# Patient Record
Sex: Male | Born: 1965 | Race: White | Hispanic: No | Marital: Single | Smoking: Current every day smoker
Health system: Southern US, Community
[De-identification: ages and names within clinical notes are randomized; demographics above are authoritative.]

## PROBLEM LIST (undated history)

## (undated) DIAGNOSIS — H9319 Tinnitus, unspecified ear: Secondary | ICD-10-CM

## (undated) DIAGNOSIS — Z720 Tobacco use: Secondary | ICD-10-CM

## (undated) DIAGNOSIS — F1010 Alcohol abuse, uncomplicated: Secondary | ICD-10-CM

## (undated) HISTORY — DX: Tobacco use: Z72.0

## (undated) HISTORY — DX: Tinnitus, unspecified ear: H93.19

## (undated) HISTORY — PX: FRACTURE SURGERY: SHX138

## (undated) HISTORY — DX: Alcohol abuse, uncomplicated: F10.10

---

## 2012-06-10 ENCOUNTER — Encounter (HOSPITAL_COMMUNITY): Payer: Self-pay | Admitting: *Deleted

## 2012-06-10 ENCOUNTER — Emergency Department (HOSPITAL_COMMUNITY)
Admission: EM | Admit: 2012-06-10 | Discharge: 2012-06-10 | Disposition: A | Discharge status: Home / Self Care | Payer: Self-pay | Attending: Emergency Medicine | Admitting: Emergency Medicine

## 2012-06-10 DIAGNOSIS — R197 Diarrhea, unspecified: Secondary | ICD-10-CM | POA: Insufficient documentation

## 2012-06-10 DIAGNOSIS — K529 Noninfective gastroenteritis and colitis, unspecified: Secondary | ICD-10-CM

## 2012-06-10 DIAGNOSIS — F17200 Nicotine dependence, unspecified, uncomplicated: Secondary | ICD-10-CM | POA: Insufficient documentation

## 2012-06-10 LAB — DIFFERENTIAL
Basophils Absolute: 0 10*3/uL (ref 0.0–0.1)
Lymphocytes Relative: 29 % (ref 12–46)
Monocytes Absolute: 0.7 10*3/uL (ref 0.1–1.0)
Neutro Abs: 3.9 10*3/uL (ref 1.7–7.7)
Neutrophils Relative %: 58 % (ref 43–77)

## 2012-06-10 LAB — COMPREHENSIVE METABOLIC PANEL
ALT: 18 U/L (ref 0–53)
AST: 18 U/L (ref 0–37)
Alkaline Phosphatase: 58 U/L (ref 39–117)
CO2: 24 mEq/L (ref 19–32)
Chloride: 102 mEq/L (ref 96–112)
GFR calc non Af Amer: >90 mL/min (ref >90)
Potassium: 3.8 mEq/L (ref 3.5–5.1)
Sodium: 139 mEq/L (ref 135–145)
Total Bilirubin: 0.7 mg/dL (ref 0.3–1.2)

## 2012-06-10 LAB — CBC
HCT: 46 % (ref 39.0–52.0)
Platelets: 179 10*3/uL (ref 150–400)
RDW: 12.3 % (ref 11.5–15.5)
WBC: 6.7 10*3/uL (ref 4.0–10.5)

## 2012-06-10 MED ORDER — SULFAMETHOXAZOLE-TRIMETHOPRIM 800-160 MG PO TABS: 1.0000 | ORAL_TABLET | Freq: Two times a day (BID) | ORAL | Status: AC

## 2012-06-10 MED ORDER — LOPERAMIDE HCL 2 MG PO CAPS: 2.0000 mg | ORAL_CAPSULE | Freq: Four times a day (QID) | ORAL | Status: AC | PRN

## 2012-06-10 NOTE — ED Notes (Signed)
Pt reports diarrhea for 6 mths, dizziness and pressure in head. Pt states that when he leans over for any length of time a clear real sticky fluid will gush from nose.  Feels as if there is water in his head and has constant slushing feeling.  Pt alert oriented X4

## 2012-06-10 NOTE — ED Notes (Signed)
Pt alert no distress.  Sitting  With no complaints

## 2012-06-10 NOTE — ED Provider Notes (Signed)
History   This chart was scribed for Nelia Shi, MD by Sofie Rower. The patient was seen in room TR07C/TR07C and the patient's care was started at 2:38 PM     CSN: [id number]  Arrival date & time 06/10/12  1244   None     Chief Complaint  Patient presents with  . Diarrhea  . Otalgia    (Consider location/radiation/quality/duration/timing/severity/associated sxs/prior treatment) Patient is a 46 y.o. male presenting with diarrhea and ear pain. The history is provided by the patient. No language interpreter was used.  Diarrhea The primary symptoms include diarrhea. Primary symptoms do not include fever, nausea or vomiting. The illness began more than 7 days ago. The onset was gradual. The problem has not changed since onset. The diarrhea began more than 1 week ago. The diarrhea is watery. The diarrhea occurs 2 to 4 times per day.  The illness does not include chills, back pain or itching.  Otalgia Associated symptoms include diarrhea. Pertinent negatives include no vomiting.    Xavier [name] is a 46 y.o. male who presents to the Emergency Department complaining of moderate, episodic diarrhea (X 3-4 a day) onset six months ago with associated symptoms of ear pain, dizziness. The pt informs the EDP that he has not ate much today, he does not feel as if he can give a stool at present. The pt is an avid camper. Modifying factors include taking Pepto bismol (one month ago) which provides moderate relief, taking 1 antibiotic pill which provides no relief.  Pt denies mucus in diarrhea, severe pain, significant weight loss.    Past Surgical History  Procedure Date  . Fracture surgery      History  Substance Use Topics  . Smoking status: Current Everyday Smoker  . Smokeless tobacco: Not on file  . Alcohol Use: Yes      Review of Systems  Constitutional: Negative for fever and chills.  HENT: Positive for ear pain.   Gastrointestinal: Positive for diarrhea. Negative for  nausea and vomiting.  Musculoskeletal: Negative for back pain.  Skin: Negative for itching.  All other systems reviewed and are negative.    Allergies  Review of patient's allergies indicates no known allergies.  Home Medications   Current Outpatient Rx  Name Route Sig Dispense Refill  . LOPERAMIDE HCL 2 MG PO CAPS Oral Take 1 capsule (2 mg total) by mouth 4 (four) times daily as needed for diarrhea or loose stools. 12 capsule 0  . SULFAMETHOXAZOLE-TRIMETHOPRIM 800-160 MG PO TABS Oral Take 1 tablet by mouth 2 (two) times daily. 10 tablet 0    BP 152/89  Temp 98.1 F (36.7 C) (Oral)  Resp 18  Wt 200 lb (90.719 kg)  SpO2 96%  Physical Exam  Nursing note and vitals reviewed. Constitutional: He is oriented to person, place, and time. He appears well-developed and well-nourished. No distress.  HENT:  Head: Normocephalic and atraumatic.  Eyes: Pupils are equal, round, and reactive to light.  Neck: Normal range of motion.  Cardiovascular: Normal rate and intact distal pulses.   Pulmonary/Chest: No respiratory distress.  Abdominal: Normal appearance. He exhibits no distension.  Musculoskeletal: Normal range of motion.  Neurological: He is alert and oriented to person, place, and time. No cranial nerve deficit.  Skin: Skin is warm and dry. No rash noted.  Psychiatric: He has a normal mood and affect. His behavior is normal.    ED Course  Procedures (including critical care time)  DIAGNOSTIC STUDIES: Oxygen Saturation  is 96% on room air, adequate by my interpretation.    COORDINATION OF CARE:   2:43PM- EDP at bedside discusses treatment plan concerning blood work and stool sample.   Results for orders placed during the hospital encounter of 06/10/12  CBC      Component Value Range   WBC 6.7  4.0 - 10.5 K/uL   RBC 4.73  4.22 - 5.81 MIL/uL   Hemoglobin 16.7  13.0 - 17.0 g/dL   HCT 40.9  81.1 - 91.4 %   MCV 97.3  78.0 - 100.0 fL   MCH 35.3 (*) 26.0 - 34.0 pg   MCHC  36.3 (*) 30.0 - 36.0 g/dL   RDW 78.2  95.6 - 21.3 %   Platelets 179  150 - 400 K/uL  DIFFERENTIAL      Component Value Range   Neutrophils Relative 58  43 - 77 %   Neutro Abs 3.9  1.7 - 7.7 K/uL   Lymphocytes Relative 29  12 - 46 %   Lymphs Abs 1.9  0.7 - 4.0 K/uL   Monocytes Relative 11  3 - 12 %   Monocytes Absolute 0.7  0.1 - 1.0 K/uL   Eosinophils Relative 2  0 - 5 %   Eosinophils Absolute 0.1  0.0 - 0.7 K/uL   Basophils Relative 0  0 - 1 %   Basophils Absolute 0.0  0.0 - 0.1 K/uL  COMPREHENSIVE METABOLIC PANEL      Component Value Range   Sodium 139  135 - 145 mEq/L   Potassium 3.8  3.5 - 5.1 mEq/L   Chloride 102  96 - 112 mEq/L   CO2 24  19 - 32 mEq/L   Glucose, Bld 88  70 - 99 mg/dL   BUN 12  6 - 23 mg/dL   Creatinine, Ser 0.86  0.50 - 1.35 mg/dL   Calcium 9.5  8.4 - 57.8 mg/dL   Total Protein 7.7  6.0 - 8.3 g/dL   Albumin 4.5  3.5 - 5.2 g/dL   AST 18  0 - 37 U/L   ALT 18  0 - 53 U/L   Alkaline Phosphatase 58  39 - 117 U/L   Total Bilirubin 0.7  0.3 - 1.2 mg/dL   GFR calc non Af Amer >90  >90 mL/min   GFR calc Af Amer >90  >90 mL/min    No results found.   1. Chronic diarrhea       MDM        I personally performed the services described in this documentation, which was scribed in my presence. The recorded information has been reviewed and considered.     Nelia Shi, MD 06/10/12 [phone]

## 2012-06-10 NOTE — ED Notes (Signed)
Patient states diarrhea x 6 months, denies n/v,  Patient also with builateral ear pain x 2 years, patient states he also gets dizzy and feels as if equilibrium is off

## 2012-06-10 NOTE — Discharge Instructions (Signed)
Chronic Diarrhea Diarrhea is loose, watery stools. Having diarrhea means passing loose stools 3 or more times a day. Diarrhea that lasts longer than 4 weeks is considered long-lasting (chronic). Symptoms of chronic diarrhea may be continual or may come and go. People of all ages can get diarrhea. Body fluid loss (dehydration) may occur as a result of diarrhea. This means the body does not have as many fluids and salts (electrolytes) as it needs. CAUSES  There are many causes of chronic diarrhea. Causes may be different for children and adults. The various causes can be grouped into 2 categories: diarrhea caused by an infection and diarrhea not caused by an infection. Sometimes, the cause is unknown. Diarrhea caused by an infection may result from:  Parasites.   Bacteria.   Viral infections.  Diarrhea not caused by an infection may result from:  Irritable bowel syndrome.   Reaction to medicines, such as antibiotics, cancer drugs, blood pressure medicines, and antacids.   Intestinal disease (Crohn's disease, ulcerative colitis, celiac disease).   Food allergies or sensitivity to additives (fructose, lactose, sugar substitutes).   Tumors.   Diabetes, thyroid disease, and other endocrine diseases.   Reduced blood flow to the intestine.   Previous surgery or radiation of the abdomen or gastrointestinal tract.  Risk factors for chronic diarrhea include:  Having a severely weakened immune system, such as from HIV/AIDS.   Taking certain types of cancer-fighting drugs (chemotherapy) or other medicines.   A recent organ transplant.   Having a portion of the stomach removed.   Traveling to countries where food and water supplies are often contaminated.  SYMPTOMS  In addition to frequent, loose stools, diarrhea may cause:  Cramping.   Abdominal pain.   Nausea.   Urgent need to use the bathroom, or loss of bowel control.  If dehydration occurs, problems include:  Thirst.    Less frequent urination.   Dark urine.   Dry skin.   Fatigue.   Dizziness.  Infections that cause diarrhea may also cause a fever, chills, or bloody stools. DIAGNOSIS  Diagnosis may be difficult. Your caregiver must take a careful history and perform a physical exam. Tests given are based on your symptoms and history. Tests may include:  Blood or stool tests, in which 3 or more stool samples may be examined. Stool cultures may be used to test for bacteria or parasites.   X-rays.   A procedure in which a thin tube is inserted into the mouth or rectum (endoscopy). This allows the caregiver to look inside the intestine.  TREATMENT   Diarrhea caused by an infection can often be treated with antibiotics.   Diarrhea not caused by an infection is more difficult to diagnose and treat. Long-term medicine use or surgery may be required. Specific treatment should be discussed with your caregiver.   If the cause cannot determined, treatment to relieve symptoms includes:   Preventing dehydration. Serious health problems can occur if you do not maintain proper fluid levels. Many oral rehydration solutions (ORS) are available at drug stores. Ask your caregiver what product is best for you.   Not drinking beverages that contain caffeine (tea, coffee, soft drinks).   Not drinking alcohol. It causes dehydration.   Not relying on sports drinks and broths alone to maintain proper fluid levels. They should not be used to prevent severe dehydration.   Maintaining well-balanced nutrition. This may help you recover faster.  PREVENTION   Drink clean or purified water.   Use proper   food handling techniques.   Maintain proper hand-washing habits.  HOME CARE INSTRUCTIONS   Avoid:   Caffeine.   Greasy foods.   High fiber.   If you have problems digesting lactose during or after an episode of diarrhea, you might want to try yogurt. Yogurt is often better tolerated, because it has less  lactose than milk. Yogurt with active, live bacterial cultures may even help you recover faster.  SEEK MEDICAL CARE IF:  The person with diarrhea is an otherwise healthy adult and has:  Signs of dehydration.   Diarrhea for more than 2 days.   Severe pain in the abdomen or rectum.   An oral temperature above 102 F (38.9 C).   Stools containing blood or pus.   Stools that are black and tarry.  SEEK IMMEDIATE MEDICAL CARE IF:  The person with diarrhea is a child, elderly person, or has a weakened immune system and has:  Signs of dehydration.   Diarrhea for more than 1 day.   Severe pain in the abdomen or rectum.   An oral temperature above 102 F (38.9 C), not controlled by medicine.   Stools containing blood or pus.   Stools that are black and tarry.  Document Released: 02/23/2004 Document Revised: 11/22/2011 Document Reviewed: 04/21/2010 [employer] Patient Information 2012 [employer]. 

## 2013-12-30 ENCOUNTER — Encounter: Payer: Self-pay | Admitting: Medical

## 2013-12-30 ENCOUNTER — Ambulatory Visit (INDEPENDENT_AMBULATORY_CARE_PROVIDER_SITE_OTHER): Payer: BC Managed Care – PPO | Admitting: Medical

## 2013-12-30 VITALS — BP 130/70 | HR 88 | Temp 98.0°F | Resp 18 | Wt 195.0 lb

## 2013-12-30 DIAGNOSIS — F1010 Alcohol abuse, uncomplicated: Secondary | ICD-10-CM

## 2013-12-30 DIAGNOSIS — J310 Chronic rhinitis: Secondary | ICD-10-CM

## 2013-12-30 DIAGNOSIS — R220 Localized swelling, mass and lump, head: Secondary | ICD-10-CM

## 2013-12-30 DIAGNOSIS — H9319 Tinnitus, unspecified ear: Secondary | ICD-10-CM

## 2013-12-30 DIAGNOSIS — F17200 Nicotine dependence, unspecified, uncomplicated: Secondary | ICD-10-CM

## 2013-12-30 DIAGNOSIS — R221 Localized swelling, mass and lump, neck: Secondary | ICD-10-CM

## 2013-12-30 NOTE — Progress Notes (Signed)
  Subjective:  Xavier [name] is a 48 y.o. male who presents as a new patient.  No recent primary care.     For 1.5 years now he notes swelling in face, in front of/underneath ears.  Feels like this is causing headache, pain above eyes, inner ear pain, swollen feeling.  Used to sleep on his side/ side of face, but now due to pain has to turn sides sleeping every 30 minutes. +sensitivity in teeth but no pain while chewing.   Last dental visit 1999.  Gets runny nose, sneezing, feels lymph nodes swollen from time to time.   If bending down, gets clear to yellow nasal drainage.  Gets ringing in ears, never feels like ears clear up in terms of pressure.  No cough, no post nasal drainage.  Denies fever, NVD. +smoker, no chewing tobacco.  No other aggravating or relieving factors.    No other c/o.  The following portions of the patient's history were reviewed and updated as appropriate: allergies, current medications, past family history, past medical history, past social history, past surgical history and problem list.  ROS Otherwise as in subjective above  Objective: Physical Exam  BP 130/70  Pulse 88  Temp(Src) 98 F (36.7 C) (Oral)  Resp 18  Wt 195 lb (88.451 kg)   General appearance: alert, no distress, WD/WN HEENT: normocephalic, sclerae anicteric, fullness of the preauricular areas, but no obvious parotitis or mass or obvious swelling, conjunctiva pink and moist, TMs pearly, nares with turbinate, clear discharge, pharynx normal Oral cavity: MMM, no lesions Neck: supple, shoddy nontender anterior and posterior lymph nodes, no thyromegaly, no masses Heart: RRR, normal S1, S2, no murmurs Lungs: CTA bilaterally, no wheezes, rhonchi, or rales Abdomen: +bs, soft, non tender, non distended, no masses, no hepatomegaly, no splenomegaly Pulses: 2+ radial pulses, 2+ pedal pulses, normal cap refill Ext: no edema   Assessment: Encounter  Diagnoses  Name Primary?  . Facial swelling Yes  . Rhinitis   . Tinnitus   . Tobacco use disorder   . Excessive drinking alcohol     Plan: Discussed risks of tobacco and excessive alcohol use.  Advised he decrease his alcohol consumption and sugary drinks .  Advised smoking cessation.  Discussed possible causes of his symptom and facial swelling.    Patient Instructions  Recommendations:  Begin Zyrtec, Benadryl or Allegra OTC at bedtime, daily for the next 3-4 weeks  Begin Ibuprofen OTC 200mg , 3 tablets twice daily or Aleve OTC twice daily for 1-2 week  Drink 5-6 glasses of water daily  Try and limit sugary drinks and alcohol  Suck on sour lemon drops for the next week  If not much improved in 3-4 weeks, we can consider evaluation to include labs, head and neck scan, chest xray

## 2013-12-30 NOTE — Patient Instructions (Signed)
Recommendations:  Begin Zyrtec, Benadryl or Allegra OTC at bedtime, daily for the next 3-4 weeks  Begin Ibuprofen OTC 200mg , 3 tablets twice daily or Aleve OTC twice daily for 1-2 week  Drink 5-6 glasses of water daily  Try and limit sugary drinks and alcohol  Suck on sour lemon drops for the next week  If not much improved in 3-4 weeks, we can consider evaluation to include labs, head and neck scan, chest xray

## 2014-01-27 ENCOUNTER — Ambulatory Visit: Payer: BC Managed Care – PPO | Admitting: Medical

## 2014-02-03 ENCOUNTER — Ambulatory Visit (INDEPENDENT_AMBULATORY_CARE_PROVIDER_SITE_OTHER): Payer: BC Managed Care – PPO | Admitting: Medical

## 2014-02-03 ENCOUNTER — Encounter: Payer: Self-pay | Admitting: Medical

## 2014-02-03 VITALS — BP 130/80 | HR 78 | Temp 97.6°F | Resp 14 | Wt 196.0 lb

## 2014-02-03 DIAGNOSIS — M6530 Trigger finger, unspecified finger: Secondary | ICD-10-CM

## 2014-02-03 DIAGNOSIS — R599 Enlarged lymph nodes, unspecified: Secondary | ICD-10-CM

## 2014-02-03 DIAGNOSIS — R220 Localized swelling, mass and lump, head: Secondary | ICD-10-CM

## 2014-02-03 DIAGNOSIS — K0510 Chronic gingivitis, plaque induced: Secondary | ICD-10-CM

## 2014-02-03 DIAGNOSIS — F17200 Nicotine dependence, unspecified, uncomplicated: Secondary | ICD-10-CM

## 2014-02-03 DIAGNOSIS — R221 Localized swelling, mass and lump, neck: Secondary | ICD-10-CM

## 2014-02-03 LAB — BASIC METABOLIC PANEL
BUN: 12 mg/dL (ref 6–23)
CALCIUM: 9.7 mg/dL (ref 8.4–10.5)
CO2: 29 meq/L (ref 19–32)
Chloride: 101 mEq/L (ref 96–112)
Creat: 0.9 mg/dL (ref 0.50–1.35)
Glucose, Bld: 89 mg/dL (ref 70–99)
POTASSIUM: 4.2 meq/L (ref 3.5–5.3)
SODIUM: 140 meq/L (ref 135–145)

## 2014-02-03 LAB — CBC WITH DIFFERENTIAL/PLATELET
BASOS ABS: 0 10*3/uL (ref 0.0–0.1)
Basophils Relative: 0 % (ref 0–1)
Eosinophils Absolute: 0.2 10*3/uL (ref 0.0–0.7)
Eosinophils Relative: 3 % (ref 0–5)
HEMATOCRIT: 45 % (ref 39.0–52.0)
HEMOGLOBIN: 16.2 g/dL (ref 13.0–17.0)
LYMPHS ABS: 1.6 10*3/uL (ref 0.7–4.0)
LYMPHS PCT: 32 % (ref 12–46)
MCH: 35.1 pg — ABNORMAL HIGH (ref 26.0–34.0)
MCHC: 36 g/dL (ref 30.0–36.0)
MCV: 97.4 fL (ref 78.0–100.0)
MONO ABS: 0.6 10*3/uL (ref 0.1–1.0)
MONOS PCT: 12 % (ref 3–12)
NEUTROS ABS: 2.7 10*3/uL (ref 1.7–7.7)
Neutrophils Relative %: 53 % (ref 43–77)
Platelets: 213 10*3/uL (ref 150–400)
RBC: 4.62 MIL/uL (ref 4.22–5.81)
RDW: 12.9 % (ref 11.5–15.5)
WBC: 5.1 10*3/uL (ref 4.0–10.5)

## 2014-02-03 LAB — SEDIMENTATION RATE: SED RATE: 1 mm/h (ref 0–16)

## 2014-02-03 NOTE — Progress Notes (Signed)
  Subjective:  Xavier [name] is a 48 y.o. male who presents for recheck.  After last visit used lemon drops, antihistamine, ibuprofen, and no change in symptoms.       For 1.5 years now he notes swelling in face, in front of/underneath ears.  Feels like this is causing headache, pain above eyes, inner ear pain, swollen feeling.  Used to sleep on his side/ side of face, has to turn sides sleeping every 30 minutes on either side of face due to the swelling pain in bilat face.  +sensitivity in teeth but no pain while chewing.   Last dental visit 1999.  Gets runny nose, sneezing, feels lymph nodes swollen from time to time.   If bending down, gets clear to yellow nasal drainage.  Gets ringing in ears, never feels like ears clear up in terms of pressure.  No cough, no post nasal drainage.  Denies fever, NVD. +smoker, some limited chewing tobacco in the past.  No other aggravating or relieving factors.    He has bilat middle fingers that stick in place.  The right one is worse.  Been this way for months, worsening. Does cause some pain.  No other c/o.   The following portions of the patient's history were reviewed and updated as appropriate: allergies, current medications, past family history, past medical history, past social history, past surgical history and problem list.  ROS Otherwise as in subjective above  Objective: Physical Exam  BP 130/80  Pulse 78  Temp(Src) 97.6 F (36.4 C) (Oral)  Resp 14  Wt 196 lb (88.905 kg)   General appearance: alert, no distress, WD/WN HEENT: normocephalic, sclerae anicteric, fullness of the preauricular areas, but no obvious parotitis or mass or obvious swelling, conjunctiva pink and moist, TMs pearly, nares with turbinate, clear discharge, pharynx normal Oral cavity: MMM, no lesions Neck: supple, shoddy nontender anterior and posterior lymph nodes, no thyromegaly, no masses Heart: RRR, normal S1,  S2, no murmurs Lungs: CTA bilaterally, no wheezes, rhonchi, or rales Abdomen: +bs, soft, non tender, non distended, no masses, no hepatomegaly, no splenomegaly Pulses: 2+ radial pulses, 2+ pedal pulses, normal cap refill Ext: no edema MSK: bilat locking trigger fingers middle/ 3rd fingers.  Palpable small bony nodules in both volar hand 3rd tendon sheaths.  Otherwise hands nontender, normal ROM. Neuro: cn2-12 intact, nonfocal exam   Assessment: Encounter Diagnoses  Name Primary?  . Neck swelling Yes  . Facial swelling   . Gingivitis   . Enlargement of lymph nodes   . Tobacco use disorder   . Trigger finger, acquired     Plan: Will have some baseline labs, chest xray, neck and maxillofacial CT per radiology opinion to further eval neck and facial swelling bilat, enlarged lymph nodes, hx/o tobacco use, both smoking and chewing.    Referral to hand surgery for bilat middle finger trigger fingers.  Advised he see dentist for significant plaque and gingival disease.  Discussed risks of tobacco and excessive alcohol use.  Advised he decrease his alcohol consumption and sugary drinks .  Advised smoking cessation.    Return pending studies, labs, referral.

## 2014-02-04 LAB — TSH: TSH: 2.177 u[IU]/mL (ref 0.350–4.500)

## 2014-02-08 ENCOUNTER — Telehealth: Payer: Self-pay | Admitting: Medical

## 2014-02-08 NOTE — Telephone Encounter (Signed)
Frustrating for him and us.  Just let us know when ready to move on with next steps.  He may want to clarify with current/prior insurance on coverage's and dates of service.  Timing and dates of service and coverage can be tricky.

## 2014-02-09 NOTE — Telephone Encounter (Signed)
Called and left message for pt. CT scan has been cancelled for this Friday. Pt will have to bring in new insurance for in April for us to get a prior auth on this and once we get it approved we will schedule him

## 2014-02-12 ENCOUNTER — Other Ambulatory Visit: Payer: Self-pay

## 2014-03-29 ENCOUNTER — Telehealth: Payer: Self-pay | Admitting: Medical

## 2014-03-29 NOTE — Telephone Encounter (Signed)
Patient is aware of his appointment for his imaging on 04/05/14 @ 215 pm. CLS

## 2014-03-29 NOTE — Telephone Encounter (Signed)
Go ahead and schedule the orders: see ordered imaging from few months ago - CT maxillofacial, CT neck, chest xray

## 2014-03-30 ENCOUNTER — Other Ambulatory Visit: Payer: Self-pay | Admitting: Family Medicine

## 2014-03-31 ENCOUNTER — Telehealth: Payer: Self-pay | Admitting: Medical

## 2014-03-31 NOTE — Telephone Encounter (Signed)
fyi

## 2014-04-05 ENCOUNTER — Other Ambulatory Visit: Payer: Self-pay | Admitting: Family Medicine

## 2014-04-05 ENCOUNTER — Other Ambulatory Visit: Payer: Self-pay

## 2014-04-05 ENCOUNTER — Ambulatory Visit
Admission: RE | Admit: 2014-04-05 | Discharge: 2014-04-05 | Disposition: A | Discharge status: Home / Self Care | Payer: PRIVATE HEALTH INSURANCE | Source: Ambulatory Visit | Attending: Medical | Admitting: Medical

## 2014-04-05 ENCOUNTER — Ambulatory Visit: Payer: PRIVATE HEALTH INSURANCE

## 2014-04-05 DIAGNOSIS — R220 Localized swelling, mass and lump, head: Secondary | ICD-10-CM

## 2014-04-05 DIAGNOSIS — M6530 Trigger finger, unspecified finger: Secondary | ICD-10-CM

## 2014-04-05 DIAGNOSIS — R599 Enlarged lymph nodes, unspecified: Secondary | ICD-10-CM

## 2014-04-05 DIAGNOSIS — F17200 Nicotine dependence, unspecified, uncomplicated: Secondary | ICD-10-CM

## 2014-04-05 DIAGNOSIS — R221 Localized swelling, mass and lump, neck: Secondary | ICD-10-CM

## 2014-04-05 DIAGNOSIS — K0510 Chronic gingivitis, plaque induced: Secondary | ICD-10-CM

## 2014-04-05 MED ORDER — IOHEXOL 300 MG/ML  SOLN
75.0000 mL | Freq: Once | INTRAMUSCULAR | Status: AC | PRN
  Administered 2014-04-05: 75 mL via INTRAVENOUS

## 2014-04-07 ENCOUNTER — Ambulatory Visit (INDEPENDENT_AMBULATORY_CARE_PROVIDER_SITE_OTHER): Payer: No Typology Code available for payment source | Admitting: Medical

## 2014-04-07 ENCOUNTER — Encounter: Payer: Self-pay | Admitting: Medical

## 2014-04-07 VITALS — BP 150/90 | HR 78 | Temp 98.2°F | Resp 18 | Wt 191.0 lb

## 2014-04-07 DIAGNOSIS — R030 Elevated blood-pressure reading, without diagnosis of hypertension: Secondary | ICD-10-CM

## 2014-04-07 DIAGNOSIS — R220 Localized swelling, mass and lump, head: Secondary | ICD-10-CM

## 2014-04-07 DIAGNOSIS — F411 Generalized anxiety disorder: Secondary | ICD-10-CM

## 2014-04-07 DIAGNOSIS — H9319 Tinnitus, unspecified ear: Secondary | ICD-10-CM

## 2014-04-07 DIAGNOSIS — R519 Headache, unspecified: Secondary | ICD-10-CM

## 2014-04-07 DIAGNOSIS — I889 Nonspecific lymphadenitis, unspecified: Secondary | ICD-10-CM

## 2014-04-07 DIAGNOSIS — IMO0001 Reserved for inherently not codable concepts without codable children: Secondary | ICD-10-CM

## 2014-04-07 DIAGNOSIS — R221 Localized swelling, mass and lump, neck: Secondary | ICD-10-CM

## 2014-04-07 DIAGNOSIS — R51 Headache: Secondary | ICD-10-CM

## 2014-04-07 DIAGNOSIS — F17200 Nicotine dependence, unspecified, uncomplicated: Secondary | ICD-10-CM

## 2014-04-07 LAB — LIPID PANEL
CHOL/HDL RATIO: 4.3 ratio
Cholesterol: 216 mg/dL — ABNORMAL HIGH (ref 0–200)
HDL: 50 mg/dL (ref >39)
LDL CALC: 97 mg/dL (ref 0–99)
Triglycerides: 344 mg/dL — ABNORMAL HIGH (ref <150)
VLDL: 69 mg/dL — ABNORMAL HIGH (ref 0–40)

## 2014-04-07 LAB — HEPATIC FUNCTION PANEL
ALK PHOS: 62 U/L (ref 39–117)
ALT: 32 U/L (ref 0–53)
AST: 26 U/L (ref 0–37)
Albumin: 4.5 g/dL (ref 3.5–5.2)
BILIRUBIN INDIRECT: 0.5 mg/dL (ref 0.2–1.2)
Bilirubin, Direct: 0.1 mg/dL (ref 0.0–0.3)
TOTAL PROTEIN: 7.3 g/dL (ref 6.0–8.3)
Total Bilirubin: 0.6 mg/dL (ref 0.2–1.2)

## 2014-04-07 MED ORDER — CYCLOBENZAPRINE HCL 10 MG PO TABS: ORAL_TABLET | ORAL | Status: DC

## 2014-04-07 NOTE — Progress Notes (Signed)
Subjective:  Xavier [name] is a 48 y.o. male who presents for recheck.    After his last visit we finally were able to get the imaging done for his head and neck and chest. He is here to discuss this a new symptoms of headaches.    For 1.5 years now he notes swelling in face, in front of/underneath ears.  Feels like this is causing headache, pain above eyes, inner ear pain, swollen feeling.  Used to sleep on his side/ side of face, has to turn sides sleeping every 30 minutes on either side of face due to the swelling pain in bilat face.  +sensitivity in teeth but no pain while chewing.   Last dental visit 1999.  Gets ringing in ears, never feels like ears clear up in terms of pressure.  No cough, no post nasal drainage.  Denies fever, NVD. +smoker, some limited chewing tobacco in the past.      Having headaches daily, regularly.  Headaches mostly all over, after a time goes to one side, usually right sided.  Headaches are 9-10/10 at first, then subside.  Does at times get blurred vision, occasional nausea.  No vision loss.  No numbness, tingling or weakness.   No hearing loss, but has ringing in ears.  At times walks into walls when having worse ringing.   These worse headaches started 2 wk ago.  Has bad allergies, been using benadryl.  Started nose spray.   Only used afrin once.  Currently allergies not so bad.  Stopped benadryl.   No family hx/o heart disease or stroke.  No other aggravating or relieving factors.     He notes anxiety problems for at least 3 years, even though he has worked with his coworkers for years, every morning when he goes into work has a panic attack.  He does feel stressed and feels like his headaches are attributed to stress.  He is currently taking care of his roommate who has terminal cancer, lately has had to do more for his room including helping feed his roommate.  Some of his anxiety he feels like stems from not  having a mother growing up.  Has 3 brothers. His never been on medicine for anxiety, never been to counseling except for brief period during his divorce  No other c/o.   The following portions of the patient's history were reviewed and updated as appropriate: allergies, current medications, past family history, past medical history, past social history, past surgical history and problem list.  ROS Otherwise as in subjective above  Objective: Physical Exam  BP 150/90  Pulse 78  Temp(Src) 98.2 F (36.8 C) (Oral)  Resp 18  Wt 191 lb (86.637 kg)  General appearance: alert, no distress, WD/WN , white male HEENT: normocephalic, sclerae anicteric, fullness of the preauricular areas, but no obvious parotitis or mass or obvious swelling, conjunctiva pink and moist, TMs pearly, nares with turbinate edema and clear discharge, pharynx normal  Oral cavity: MMM, no lesions  Neck: supple, nontender anterior and posterior lymph nodes, no obvious lymphadenopathy, no thyromegaly, no masses, no bruits Heart: RRR, normal S1, S2, no murmurs  Lungs: CTA bilaterally, no wheezes, rhonchi, or rales  Back: mild paraspinal upper back tenderness Abdomen: +bs, soft, non tender, non distended, no masses, no hepatomegaly, no splenomegaly  Pulses: 2+ radial pulses, 2+ pedal pulses, normal cap refill  Ext: no edema  Neuro: cn2-12 intact, nonfocal exam   Adult ECG Report  Indication: dizziness, smoker, excessive alcohol  consumption, elevateiond blood pressure, occipital headaches  Rate: 72bpm  Rhythm: normal sinus rhythm  QRS Axis: 61degrees  PR Interval: 180ms  QRS Duration: 98ms  QTc: 409ms  Conduction Disturbances: none  Other Abnormalities: none  Patient's cardiac risk factors are: male gender and smoking/ tobacco exposure.  EKG comparison: none  Narrative Interpretation: normal EKG     Assessment: Encounter Diagnoses  Name Primary?  . Facial swelling Yes  . Frequent headaches   .  Lymphadenitis   . Elevated blood pressure   . Tinnitus   . Headache, occipital     Plan: We reviewed his recent chest x-ray, maxillofacial and neck CTs.  given his new headaches, additional labs today, EKG reviewed above.  Vision exam normal Hearing exam normal.   Facial swelling that is not apparent on exam is likely due to gingival disease, dental disease, and minimal subparotid lymphadenitis. Advised he see dentist for significant plaque and gingival disease.  Headaches, sleep probvlems - We discussed his many concerns today.  Discussed risks of tobacco and excessive alcohol use.  Advised he decrease his alcohol consumption and sugary drinks .  Advised smoking cessation.  Begin Flexeril when necessary at bedtime for muscle tension, headaches, facial pain with sleep.  Anxiety, longstanding - discussed his concerns, treatment options, gave counseling, Referral to counseling, Marjie SkiffLauren Atkinson.  I asked him to check his blood pressures and write them down, return the readings in one week  Return 3-4 wk.

## 2014-04-08 LAB — SEDIMENTATION RATE: SED RATE: 1 mm/h (ref 0–16)

## 2014-04-08 NOTE — Progress Notes (Signed)
LM to CB

## 2014-04-13 NOTE — Progress Notes (Signed)
LMOM TO CB. X 2 CLS 

## 2018-01-20 ENCOUNTER — Inpatient Hospital Stay (HOSPITAL_COMMUNITY)
Admission: EM | Admit: 2018-01-20 | Discharge: 2018-01-27 | DRG: 339 | Disposition: A | Discharge status: Home / Self Care | Payer: Self-pay | Attending: General Surgery | Admitting: General Surgery

## 2018-01-20 ENCOUNTER — Emergency Department (HOSPITAL_COMMUNITY): Payer: Self-pay

## 2018-01-20 ENCOUNTER — Other Ambulatory Visit: Payer: Self-pay

## 2018-01-20 ENCOUNTER — Encounter (HOSPITAL_COMMUNITY): Payer: Self-pay | Admitting: *Deleted

## 2018-01-20 DIAGNOSIS — F17290 Nicotine dependence, other tobacco product, uncomplicated: Secondary | ICD-10-CM | POA: Diagnosis present

## 2018-01-20 DIAGNOSIS — F1010 Alcohol abuse, uncomplicated: Secondary | ICD-10-CM | POA: Diagnosis present

## 2018-01-20 DIAGNOSIS — K37 Unspecified appendicitis: Secondary | ICD-10-CM | POA: Diagnosis present

## 2018-01-20 DIAGNOSIS — K3533 Acute appendicitis with perforation and localized peritonitis, with abscess: Principal | ICD-10-CM | POA: Diagnosis present

## 2018-01-20 DIAGNOSIS — K567 Ileus, unspecified: Secondary | ICD-10-CM | POA: Diagnosis not present

## 2018-01-20 DIAGNOSIS — R42 Dizziness and giddiness: Secondary | ICD-10-CM | POA: Diagnosis not present

## 2018-01-20 LAB — COMPREHENSIVE METABOLIC PANEL
ALBUMIN: 4 g/dL (ref 3.5–5.0)
ALT: 15 U/L — AB (ref 17–63)
AST: 17 U/L (ref 15–41)
Alkaline Phosphatase: 70 U/L (ref 38–126)
Anion gap: 15 (ref 5–15)
BUN: 10 mg/dL (ref 6–20)
CHLORIDE: 99 mmol/L — AB (ref 101–111)
CO2: 23 mmol/L (ref 22–32)
CREATININE: 1.07 mg/dL (ref 0.61–1.24)
Calcium: 9.6 mg/dL (ref 8.9–10.3)
GFR calc Af Amer: >60 mL/min (ref >60)
GFR calc non Af Amer: >60 mL/min (ref >60)
GLUCOSE: 99 mg/dL (ref 65–99)
Potassium: 3.9 mmol/L (ref 3.5–5.1)
Sodium: 137 mmol/L (ref 135–145)
Total Bilirubin: 1.3 mg/dL — ABNORMAL HIGH (ref 0.3–1.2)
Total Protein: 8.1 g/dL (ref 6.5–8.1)

## 2018-01-20 LAB — CBC
HCT: 49.7 % (ref 39.0–52.0)
Hemoglobin: 17.3 g/dL — ABNORMAL HIGH (ref 13.0–17.0)
MCH: 35.6 pg — AB (ref 26.0–34.0)
MCHC: 34.8 g/dL (ref 30.0–36.0)
MCV: 102.3 fL — AB (ref 78.0–100.0)
PLATELETS: 232 10*3/uL (ref 150–400)
RBC: 4.86 MIL/uL (ref 4.22–5.81)
RDW: 12.6 % (ref 11.5–15.5)
WBC: 18 10*3/uL — ABNORMAL HIGH (ref 4.0–10.5)

## 2018-01-20 LAB — URINALYSIS, ROUTINE W REFLEX MICROSCOPIC
Bacteria, UA: NONE SEEN
Bilirubin Urine: NEGATIVE
Glucose, UA: NEGATIVE mg/dL
KETONES UR: 20 mg/dL — AB
Leukocytes, UA: NEGATIVE
Nitrite: NEGATIVE
PH: 5 (ref 5.0–8.0)
Protein, ur: 30 mg/dL — AB

## 2018-01-20 LAB — LIPASE, BLOOD: LIPASE: 25 U/L (ref 11–51)

## 2018-01-20 MED ORDER — MORPHINE SULFATE (PF) 4 MG/ML IV SOLN
4.0000 mg | Freq: Once | INTRAVENOUS | Status: AC
  Administered 2018-01-20: 4 mg via INTRAVENOUS
  Filled 2018-01-20: qty 1

## 2018-01-20 MED ORDER — METRONIDAZOLE IN NACL 5-0.79 MG/ML-% IV SOLN
500.0000 mg | Freq: Three times a day (TID) | INTRAVENOUS | Status: DC
  Administered 2018-01-21 – 2018-01-23 (×7): 500 mg via INTRAVENOUS
  Filled 2018-01-20 (×8): qty 100

## 2018-01-20 MED ORDER — IOPAMIDOL (ISOVUE-300) INJECTION 61%
INTRAVENOUS | Status: AC
  Administered 2018-01-20: 100 mL
  Filled 2018-01-20: qty 100

## 2018-01-20 MED ORDER — HYDROMORPHONE HCL 1 MG/ML IJ SOLN
1.0000 mg | INTRAMUSCULAR | Status: DC | PRN
  Administered 2018-01-21 – 2018-01-23 (×12): 1 mg via INTRAVENOUS
  Filled 2018-01-20 (×12): qty 1

## 2018-01-20 MED ORDER — SODIUM CHLORIDE 0.9 % IV SOLN
INTRAVENOUS | Status: DC
  Administered 2018-01-20 – 2018-01-26 (×12): via INTRAVENOUS

## 2018-01-20 MED ORDER — ONDANSETRON 4 MG PO TBDP
4.0000 mg | ORAL_TABLET | Freq: Once | ORAL | Status: AC
  Administered 2018-01-20: 4 mg via ORAL
  Filled 2018-01-20: qty 1

## 2018-01-20 MED ORDER — ACETAMINOPHEN 650 MG RE SUPP
650.0000 mg | Freq: Four times a day (QID) | RECTAL | Status: DC | PRN
Start: 2018-01-20 — End: 2018-01-27

## 2018-01-20 MED ORDER — ACETAMINOPHEN 325 MG PO TABS: 650.0000 mg | ORAL_TABLET | Freq: Four times a day (QID) | ORAL | Status: DC | PRN

## 2018-01-20 MED ORDER — DEXTROSE 5 % IV SOLN
2.0000 g | Freq: Once | INTRAVENOUS | Status: AC
  Administered 2018-01-20: 2 g via INTRAVENOUS
  Filled 2018-01-20: qty 2

## 2018-01-20 MED ORDER — KETOROLAC TROMETHAMINE 15 MG/ML IJ SOLN
15.0000 mg | Freq: Once | INTRAMUSCULAR | Status: AC
  Administered 2018-01-20: 15 mg via INTRAVENOUS
  Filled 2018-01-20: qty 1

## 2018-01-20 MED ORDER — ONDANSETRON 4 MG PO TBDP
4.0000 mg | ORAL_TABLET | Freq: Four times a day (QID) | ORAL | Status: DC | PRN
  Administered 2018-01-22: 4 mg via ORAL
  Filled 2018-01-20: qty 1

## 2018-01-20 MED ORDER — FENTANYL CITRATE (PF) 100 MCG/2ML IJ SOLN
50.0000 ug | Freq: Once | INTRAMUSCULAR | Status: AC
  Administered 2018-01-20: 50 ug via INTRAVENOUS
  Filled 2018-01-20: qty 2

## 2018-01-20 MED ORDER — DEXTROSE 5 % IV SOLN
2.0000 g | INTRAVENOUS | Status: DC
  Administered 2018-01-21 – 2018-01-26 (×6): 2 g via INTRAVENOUS
  Filled 2018-01-20 (×6): qty 2

## 2018-01-20 MED ORDER — ENOXAPARIN SODIUM 40 MG/0.4ML ~~LOC~~ SOLN
40.0000 mg | Freq: Every day | SUBCUTANEOUS | Status: DC
  Administered 2018-01-22 – 2018-01-27 (×6): 40 mg via SUBCUTANEOUS
  Filled 2018-01-20 (×7): qty 0.4

## 2018-01-20 MED ORDER — METRONIDAZOLE IN NACL 5-0.79 MG/ML-% IV SOLN
500.0000 mg | Freq: Once | INTRAVENOUS | Status: AC
  Administered 2018-01-20: 500 mg via INTRAVENOUS
  Filled 2018-01-20: qty 100

## 2018-01-20 MED ORDER — ONDANSETRON HCL 4 MG/2ML IJ SOLN
4.0000 mg | Freq: Four times a day (QID) | INTRAMUSCULAR | Status: DC | PRN
  Administered 2018-01-21 – 2018-01-25 (×4): 4 mg via INTRAVENOUS
  Filled 2018-01-20 (×3): qty 2

## 2018-01-20 MED ORDER — SODIUM CHLORIDE 0.9 % IV BOLUS (SEPSIS)
1000.0000 mL | Freq: Once | INTRAVENOUS | Status: AC
  Administered 2018-01-20: 1000 mL via INTRAVENOUS

## 2018-01-20 NOTE — ED Notes (Signed)
Surgeon at bedside.  

## 2018-01-20 NOTE — Progress Notes (Signed)
Patient arrived to 6N31 from ED with appendicitis. Alert and oriented x4. Vital signs stable. Denies pain at this time. Oriented to room and call bell.

## 2018-01-20 NOTE — ED Provider Notes (Signed)
Patient placed in Quick Look pathway, seen and evaluated   Chief Complaint: Right lower quadrant abdominal pain  HPI:   Patient presents to the ED for evaluation of right lower quadrant abdominal pain that started 1-2 days ago.  Pain does not radiate.  Progressively worsened.  Reports associated nausea and emesis.  Denies change in bowel habits, fevers.  ROS: Abdominal pain, nausea, emesis.  Denies any urinary symptoms, change in bowel habits, fever.  (one)  Physical Exam:   Gen: No distress  Neuro: Awake and Alert  Skin: Warm    Focused Exam: Right lower quadrant abdominal pain to palpation with guarding noted.  No rebound noted.  No CVA tenderness.  No signs of peritonitis.  Positive McBurney's point tenderness.  Negative Murphy sign.  Negative Rovsing sign.  Patient appears uncomfortable due to pain.  Initiation of care has begun. The patient has been counseled on the process, plan, and necessity for staying for the completion/evaluation, and the remainder of the medical screening examination.  Lab work and imaging ordered with along with pain medication given patient being uncomfortable in the room.    Rise MuLeaphart, Alta Shober T, PA-C 01/20/18 1933    Margarita Grizzleay, Danielle, MD 01/21/18 [phone]

## 2018-01-20 NOTE — H&P (Signed)
Xavier [name] is an 52 y.o. male.   Chief Complaint: abd pain HPI: 62 yom with abd pain primarily rlq since Sunday.  This has been progressive, nothing making it better, associated with diarrhea/n/v. No fevers. Not eating much.  States he may have had prior csc years ago while in prison.  Underwent ct scan that shows appendicitis, appears uncomplicated. He did not really endorse drinking but is all over chart.   Past Medical History:  Diagnosis Date  . Excessive drinking alcohol   . Tinnitus   . Tobacco use     Past Surgical History:  Procedure Laterality Date  . FRACTURE SURGERY      History reviewed. No pertinent family history. Social History:  reports that he has been smoking cigars.  He does not have any smokeless tobacco history on file. He reports that he drinks about 21.0 oz of alcohol per week. He reports that he does not use drugs.  Allergies: No Known Allergies  meds none  Results for orders placed or performed during the hospital encounter of 01/20/18 (from the past 48 hour(s))  Lipase, blood     Status: None   Collection Time: 01/20/18  7:25 PM  Result Value Ref Range   Lipase 25 11 - 51 U/L    Comment: Performed at [facility], [place] [address], [place], [place] [address]  Comprehensive metabolic panel     Status: Abnormal   Collection Time: 01/20/18  7:25 PM  Result Value Ref Range   Sodium 137 135 - 145 mmol/L   Potassium 3.9 3.5 - 5.1 mmol/L   Chloride 99 (L) 101 - 111 mmol/L   CO2 23 22 - 32 mmol/L   Glucose, Bld 99 65 - 99 mg/dL   BUN 10 6 - 20 mg/dL   Creatinine, Ser 1.07 0.61 - 1.24 mg/dL   Calcium 9.6 8.9 - 10.3 mg/dL   Total Protein 8.1 6.5 - 8.1 g/dL   Albumin 4.0 3.5 - 5.0 g/dL   AST 17 15 - 41 U/L   ALT 15 (L) 17 - 63 U/L   Alkaline Phosphatase 70 38 - 126 U/L   Total Bilirubin 1.3 (H) 0.3 - 1.2 mg/dL   GFR calc non Af Amer >60 >60 mL/min   GFR calc Af Amer >60 >60 mL/min    Comment: (NOTE) The eGFR has been calculated using the CKD  EPI equation. This calculation has not been validated in all clinical situations. eGFR's persistently <60 mL/min signify possible Chronic Kidney Disease.    Anion gap 15 5 - 15    Comment: Performed at [facility] [address], [place], [place] [address]  CBC     Status: Abnormal   Collection Time: 01/20/18  7:25 PM  Result Value Ref Range   WBC 18.0 (H) 4.0 - 10.5 K/uL   RBC 4.86 4.22 - 5.81 MIL/uL   Hemoglobin 17.3 (H) 13.0 - 17.0 g/dL   HCT 49.7 39.0 - 52.0 %   MCV 102.3 (H) 78.0 - 100.0 fL   MCH 35.6 (H) 26.0 - 34.0 pg   MCHC 34.8 30.0 - 36.0 g/dL   RDW 12.6 11.5 - 15.5 %   Platelets 232 150 - 400 K/uL    Comment: Performed at [facility] [address], [place], [place] [address]  Urinalysis, Routine w reflex microscopic     Status: Abnormal   Collection Time: 01/20/18  9:42 PM  Result Value Ref Range  Color, Urine AMBER (A) YELLOW    Comment: BIOCHEMICALS MAY BE AFFECTED BY COLOR   APPearance CLEAR CLEAR   Specific Gravity, Urine >1.046 (H) 1.005 - 1.030   pH 5.0 5.0 - 8.0   Glucose, UA NEGATIVE NEGATIVE mg/dL   Hgb urine dipstick SMALL (A) NEGATIVE   Bilirubin Urine NEGATIVE NEGATIVE   Ketones, ur 20 (A) NEGATIVE mg/dL   Protein, ur 30 (A) NEGATIVE mg/dL   Nitrite NEGATIVE NEGATIVE   Leukocytes, UA NEGATIVE NEGATIVE   RBC / HPF 0-5 0 - 5 RBC/hpf   WBC, UA 0-5 0 - 5 WBC/hpf   Bacteria, UA NONE SEEN NONE SEEN   Squamous Epithelial / LPF 0-5 (A) NONE SEEN   Mucus PRESENT     Comment: Performed at [facility], [place] [address] [place], [place] [address]   Ct Abdomen Pelvis W Contrast  Result Date: 01/20/2018 CLINICAL DATA:  Acute generalized abdominal pain, RIGHT lower quadrant pain with nausea and diarrhea for 1 day, similar symptoms of lesser severity 2 weeks ago which resolved, smoker EXAM: CT ABDOMEN AND PELVIS WITH CONTRAST TECHNIQUE: Multidetector CT imaging of the abdomen and pelvis was performed using the standard protocol following bolus  administration of intravenous contrast. Sagittal and coronal MPR images reconstructed from axial data set. CONTRAST:  100 ML ISOVUE-300 IOPAMIDOL (ISOVUE-300) INJECTION 61% IV. No oral contrast. COMPARISON:  None FINDINGS: Lower chest: Lung bases clear Hepatobiliary: Gallbladder and liver normal appearance Pancreas: Normal appearance Spleen: Normal appearance Adrenals/Urinary Tract: Adrenal glands, kidneys, ureters, and bladder normal appearance Stomach/Bowel: Stomach and colon normal appearance. Mild thickening of the terminal ileal wall, reactive. Changes of acute appendicitis are present: Appendix: Location: Caudal to cecal tip Diameter: 10 mm Appendicolith: None Mucosal hyper-enhancement: Present Extraluminal gas: Absent Periappendiceal collection: Edema without discrete periappendiceal fluid collection Vascular/Lymphatic: Unremarkable Reproductive: Minimal prostatic enlargement 4.4 x 3.3 cm image 82. Seminal vesicles unremarkable. Other: No free air or free fluid.  No hernia. 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 No acute osseous findings. IMPRESSION: Acute appendicitis as above. Electronically Signed   By: Lavonia Dana M.D.   On: 01/20/2018 20:26    Review of Systems  Gastrointestinal: Positive for abdominal pain, diarrhea, nausea and vomiting.  All other systems reviewed and are negative.   Blood pressure 129/84, pulse 74, temperature 99.4 F (37.4 C), temperature source Oral, resp. rate 16, SpO2 94 %. Physical Exam  Constitutional: He is oriented to person, place, and time. He appears well-developed and well-nourished.  HENT:  Head: Normocephalic.  Right Ear: External ear normal.  Left Ear: External ear normal.  Mouth/Throat: Oropharynx is clear and moist.  Eyes: Pupils are equal, round, and reactive to light. No scleral icterus.  Neck: Neck supple.  Cardiovascular: Normal rate, regular rhythm and normal heart sounds.  Respiratory: Effort normal and  breath sounds normal. He has no wheezes. He has no rales.  GI: Soft. Bowel sounds are normal. He exhibits no distension. There is tenderness in the right lower quadrant. There is tenderness at McBurney's point. No hernia.  Musculoskeletal: He exhibits no edema.  Lymphadenopathy:    He has no cervical adenopathy.  Neurological: He is alert and oriented to person, place, and time.  Skin: Skin is warm and dry.  Psychiatric: He has a normal mood and affect. His behavior is normal.     Assessment/Plan Appendicitis  Appears he has uncomplicated appendicitis although wbc high.  Discussed physiology of appendicitis. We discussed options of abx vs  surgery. I recommended surgery with lap appy.  Will admit, is on abx already per protocol, npo after mn and plan for lap appy in am.  Will need csc in a couple months  Rolm Bookbinder, MD 01/20/2018, 10:32 PM

## 2018-01-20 NOTE — ED Notes (Signed)
ED Provider at bedside. 

## 2018-01-20 NOTE — ED Provider Notes (Signed)
[facility] EMERGENCY DEPARTMENT Provider Note   CSN: [id number] Arrival date & time: 01/20/18  1813     History   Chief Complaint Chief Complaint  Patient presents with  . Abdominal Pain    HPI Xavier [name] is a 52 y.o. male.  HPI   52 year old male presents today with complaints of abdominal pain.  Patient reports symptoms started 2 days ago with right lower quadrant abdominal pain.  He notes diarrhea that has been black.  Patient reports some nausea and dry heaving today.  He reports very little fluid intake, no food intake, he notes chills over the last day.  Patient notes the pain radiates into his testicles.  Patient denies any abdominal surgery history.   Past Medical History:  Diagnosis Date  . Excessive drinking alcohol   . Tinnitus   . Tobacco use     There are no active problems to display for this patient.   Past Surgical History:  Procedure Laterality Date  . FRACTURE SURGERY         Home Medications    Prior to Admission medications   Medication Sig Start Date End Date Taking? Authorizing Provider  cyclobenzaprine (FLEXERIL) 10 MG tablet 1/2-1 tablet po QHS 04/07/14   Tysinger, Kermit Balo, PA-C    Family History History reviewed. No pertinent family history.  Social History Social History   Tobacco Use  . Smoking status: Current Every Day Smoker    Types: Cigars  Substance Use Topics  . Alcohol use: Yes    Alcohol/week: 21.0 oz    Types: 35 Cans of beer per week  . Drug use: No     Allergies   Patient has no known allergies.   Review of Systems Review of Systems  All other systems reviewed and are negative.    Physical Exam Updated Vital Signs BP 140/88   Pulse 74   Temp 99.4 F (37.4 C) (Oral)   Resp 18   SpO2 96%   Physical Exam  Constitutional: He is oriented to person, place, and time. He appears well-developed and well-nourished.  HENT:  Head: Normocephalic and atraumatic.  Eyes: Conjunctivae  are normal. Pupils are equal, round, and reactive to light. Right eye exhibits no discharge. Left eye exhibits no discharge. No scleral icterus.  Neck: Normal range of motion. No JVD present. No tracheal deviation present.  Pulmonary/Chest: Effort normal. No stridor.  Abdominal:  Tenderness palpation right lower quadrant with guarding   Neurological: He is alert and oriented to person, place, and time. Coordination normal.  Psychiatric: He has a normal mood and affect. His behavior is normal. Judgment and thought content normal.  Nursing note and vitals reviewed.    ED Treatments / Results  Labs (all labs ordered are listed, but only abnormal results are displayed) Labs Reviewed  COMPREHENSIVE METABOLIC PANEL - Abnormal; Notable for the following components:      Result Value   Chloride 99 (*)    ALT 15 (*)    Total Bilirubin 1.3 (*)    All other components within normal limits  CBC - Abnormal; Notable for the following components:   WBC 18.0 (*)    Hemoglobin 17.3 (*)    MCV 102.3 (*)    MCH 35.6 (*)    All other components within normal limits  LIPASE, BLOOD  URINALYSIS, ROUTINE W REFLEX MICROSCOPIC    EKG  EKG Interpretation None       Radiology Ct Abdomen Pelvis W Contrast  Result Date: 01/20/2018 CLINICAL DATA:  Acute generalized abdominal pain, RIGHT lower quadrant pain with nausea and diarrhea for 1 day, similar symptoms of lesser severity 2 weeks ago which resolved, smoker EXAM: CT ABDOMEN AND PELVIS WITH CONTRAST TECHNIQUE: Multidetector CT imaging of the abdomen and pelvis was performed using the standard protocol following bolus administration of intravenous contrast. Sagittal and coronal MPR images reconstructed from axial data set. CONTRAST:  100 ML ISOVUE-300 IOPAMIDOL (ISOVUE-300) INJECTION 61% IV. No oral contrast. COMPARISON:  None FINDINGS: Lower chest: Lung bases clear Hepatobiliary: Gallbladder and liver normal appearance Pancreas: Normal appearance Spleen:  Normal appearance Adrenals/Urinary Tract: Adrenal glands, kidneys, ureters, and bladder normal appearance Stomach/Bowel: Stomach and colon normal appearance. Mild thickening of the terminal ileal wall, reactive. Changes of acute appendicitis are present: Appendix: Location: Caudal to cecal tip Diameter: 10 mm Appendicolith: None Mucosal hyper-enhancement: Present Extraluminal gas: Absent Periappendiceal collection: Edema without discrete periappendiceal fluid collection Vascular/Lymphatic: Unremarkable Reproductive: Minimal prostatic enlargement 4.4 x 3.3 cm image 82. Seminal vesicles unremarkable. Other: No free air or free fluid.  No hernia. 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 No acute osseous findings. IMPRESSION: Acute appendicitis as above. Electronically Signed   By: Ulyses SouthwardMark  Boles M.D.   On: 01/20/2018 20:26    Procedures Procedures (including critical care time)  Medications Ordered in ED Medications  cefTRIAXone (ROCEPHIN) 2 g in dextrose 5 % 50 mL IVPB (not administered)    And  metroNIDAZOLE (FLAGYL) IVPB 500 mg (500 mg Intravenous New Bag/Given 01/20/18 2125)  ondansetron (ZOFRAN-ODT) disintegrating tablet 4 mg (4 mg Oral Given 01/20/18 1935)  fentaNYL (SUBLIMAZE) injection 50 mcg (50 mcg Intravenous Given 01/20/18 1935)  iopamidol (ISOVUE-300) 61 % injection (100 mLs  Contrast Given 01/20/18 2000)  morphine 4 MG/ML injection 4 mg (4 mg Intravenous Given 01/20/18 2106)  sodium chloride 0.9 % bolus 1,000 mL (1,000 mLs Intravenous New Bag/Given 01/20/18 2106)     Initial Impression / Assessment and Plan / ED Course  I have reviewed the triage vital signs and the nursing notes.  Pertinent labs & imaging results that were available during my care of the patient were reviewed by me and considered in my medical decision making (see chart for details).      Final Clinical Impressions(s) / ED Diagnoses   Final diagnoses:  Appendicitis, unspecified appendicitis  type    Labs: Lipase, CMP, CBC  Imaging: CT abdomen and pelvis with contrast  Consults:  Therapeutics: Morphine, normal saline, ceftriaxone, metronidazole  Discharge Meds:   Assessment/Plan: Patient's presentation is most consistent with acute appendicitis.  He is afebrile here has an elevated white count with CT findings of the above.  He will be started prophylactically on ceftriaxone and metronidazole, general surgery will be consulted.  Surgery will evaluate patient here in the ED for likely hospital admission     ED Discharge Orders    None       Rosalio LoudHedges, Masaye Gatchalian, PA-C 01/20/18 2144    Phillis HaggisMabe, Martha L, MD 01/20/18 2200

## 2018-01-20 NOTE — ED Triage Notes (Addendum)
Pt reports onset of RLQ pain last night that has been constant. Had n/v at onset of pain, frequent bowel movements today. Appears in severe pain at triage.

## 2018-01-21 ENCOUNTER — Inpatient Hospital Stay (HOSPITAL_COMMUNITY): Payer: Self-pay | Admitting: Anesthesiology

## 2018-01-21 ENCOUNTER — Encounter (HOSPITAL_COMMUNITY): Payer: Self-pay | Admitting: *Deleted

## 2018-01-21 ENCOUNTER — Encounter (HOSPITAL_COMMUNITY): Admission: EM | Disposition: A | Discharge status: Home / Self Care | Payer: Self-pay | Source: Home / Self Care

## 2018-01-21 HISTORY — PX: LAPAROSCOPIC APPENDECTOMY: SHX408

## 2018-01-21 LAB — HIV ANTIBODY (ROUTINE TESTING W REFLEX): HIV Screen 4th Generation wRfx: NONREACTIVE

## 2018-01-21 LAB — SURGICAL PCR SCREEN
MRSA, PCR: NEGATIVE
Staphylococcus aureus: NEGATIVE

## 2018-01-21 SURGERY — APPENDECTOMY, LAPAROSCOPIC
Anesthesia: General | Site: Abdomen

## 2018-01-21 MED ORDER — EPHEDRINE SULFATE 50 MG/ML IJ SOLN
INTRAMUSCULAR | Status: DC | PRN
  Administered 2018-01-21: 5 mg via INTRAVENOUS

## 2018-01-21 MED ORDER — HYDROMORPHONE HCL 1 MG/ML IJ SOLN
INTRAMUSCULAR | Status: AC
  Administered 2018-01-21: 1 mg
  Filled 2018-01-21: qty 1

## 2018-01-21 MED ORDER — LACTATED RINGERS IV SOLN
INTRAVENOUS | Status: DC
  Administered 2018-01-21 (×3): via INTRAVENOUS

## 2018-01-21 MED ORDER — PHENYLEPHRINE 40 MCG/ML (10ML) SYRINGE FOR IV PUSH (FOR BLOOD PRESSURE SUPPORT)
PREFILLED_SYRINGE | INTRAVENOUS | Status: AC
  Filled 2018-01-21: qty 10

## 2018-01-21 MED ORDER — GLYCOPYRROLATE 0.2 MG/ML IJ SOLN
INTRAMUSCULAR | Status: DC | PRN
  Administered 2018-01-21: 0.2 mg via INTRAVENOUS

## 2018-01-21 MED ORDER — FENTANYL CITRATE (PF) 100 MCG/2ML IJ SOLN
INTRAMUSCULAR | Status: DC | PRN
  Administered 2018-01-21: 100 ug via INTRAVENOUS
  Administered 2018-01-21: 50 ug via INTRAVENOUS
  Administered 2018-01-21: 100 ug via INTRAVENOUS

## 2018-01-21 MED ORDER — DEXMEDETOMIDINE HCL 200 MCG/2ML IV SOLN
INTRAVENOUS | Status: DC | PRN
  Administered 2018-01-21: 12 ug via INTRAVENOUS
  Administered 2018-01-21: 4 ug via INTRAVENOUS
  Administered 2018-01-21: 8 ug via INTRAVENOUS
  Administered 2018-01-21: 12 ug via INTRAVENOUS
  Administered 2018-01-21: 8 ug via INTRAVENOUS

## 2018-01-21 MED ORDER — OXYCODONE HCL 5 MG PO TABS
5.0000 mg | ORAL_TABLET | Freq: Once | ORAL | Status: AC | PRN
  Administered 2018-01-21: 5 mg via ORAL

## 2018-01-21 MED ORDER — OXYCODONE HCL 5 MG/5ML PO SOLN: 5.0000 mg | Freq: Once | ORAL | Status: AC | PRN

## 2018-01-21 MED ORDER — 0.9 % SODIUM CHLORIDE (POUR BTL) OPTIME
TOPICAL | Status: DC | PRN
  Administered 2018-01-21: 1000 mL

## 2018-01-21 MED ORDER — CHLORHEXIDINE GLUCONATE CLOTH 2 % EX PADS: 6.0000 | MEDICATED_PAD | Freq: Once | CUTANEOUS | Status: DC

## 2018-01-21 MED ORDER — PROPOFOL 10 MG/ML IV BOLUS
INTRAVENOUS | Status: AC
Start: 2018-01-21 — End: 2018-01-21
  Filled 2018-01-21: qty 20

## 2018-01-21 MED ORDER — LIDOCAINE HCL (CARDIAC) 20 MG/ML IV SOLN
INTRAVENOUS | Status: DC | PRN
  Administered 2018-01-21: 60 mg via INTRAVENOUS

## 2018-01-21 MED ORDER — MEPERIDINE HCL 25 MG/ML IJ SOLN: 6.2500 mg | INTRAMUSCULAR | Status: DC | PRN

## 2018-01-21 MED ORDER — ROCURONIUM BROMIDE 100 MG/10ML IV SOLN
INTRAVENOUS | Status: DC | PRN
  Administered 2018-01-21: 10 mg via INTRAVENOUS
  Administered 2018-01-21: 20 mg via INTRAVENOUS
  Administered 2018-01-21: 50 mg via INTRAVENOUS

## 2018-01-21 MED ORDER — MIDAZOLAM HCL 5 MG/5ML IJ SOLN
INTRAMUSCULAR | Status: DC | PRN
  Administered 2018-01-21: 2 mg via INTRAVENOUS

## 2018-01-21 MED ORDER — DEXMEDETOMIDINE HCL IN NACL 200 MCG/50ML IV SOLN
INTRAVENOUS | Status: AC
Start: 2018-01-21 — End: 2018-01-21
  Filled 2018-01-21: qty 50

## 2018-01-21 MED ORDER — BUPIVACAINE-EPINEPHRINE (PF) 0.5% -1:200000 IJ SOLN
INTRAMUSCULAR | Status: AC
  Filled 2018-01-21: qty 30

## 2018-01-21 MED ORDER — SUGAMMADEX SODIUM 200 MG/2ML IV SOLN
INTRAVENOUS | Status: DC | PRN
  Administered 2018-01-21: 165.4 mg via INTRAVENOUS

## 2018-01-21 MED ORDER — HYDROMORPHONE HCL 1 MG/ML IJ SOLN
0.2500 mg | INTRAMUSCULAR | Status: DC | PRN
  Administered 2018-01-21 (×2): 0.5 mg via INTRAVENOUS

## 2018-01-21 MED ORDER — OXYCODONE HCL 5 MG PO TABS
ORAL_TABLET | ORAL | Status: AC
  Filled 2018-01-21: qty 1

## 2018-01-21 MED ORDER — MIDAZOLAM HCL 2 MG/2ML IJ SOLN
INTRAMUSCULAR | Status: AC
  Filled 2018-01-21: qty 2

## 2018-01-21 MED ORDER — SODIUM CHLORIDE 0.9 % IR SOLN
Status: DC | PRN
  Administered 2018-01-21 (×2): 1000 mL

## 2018-01-21 MED ORDER — FENTANYL CITRATE (PF) 250 MCG/5ML IJ SOLN
INTRAMUSCULAR | Status: AC
  Filled 2018-01-21: qty 5

## 2018-01-21 MED ORDER — CEFOTETAN DISODIUM-DEXTROSE 2-2.08 GM-%(50ML) IV SOLR
2.0000 g | INTRAVENOUS | Status: AC
  Administered 2018-01-21: 2 g via INTRAVENOUS
  Filled 2018-01-21: qty 50

## 2018-01-21 MED ORDER — BUPIVACAINE-EPINEPHRINE 0.5% -1:200000 IJ SOLN
INTRAMUSCULAR | Status: DC | PRN
  Administered 2018-01-21: 30 mL

## 2018-01-21 MED ORDER — STERILE WATER FOR IRRIGATION IR SOLN
Status: DC | PRN
  Administered 2018-01-21: 300 mL

## 2018-01-21 MED ORDER — PROMETHAZINE HCL 25 MG/ML IJ SOLN: 6.2500 mg | INTRAMUSCULAR | Status: DC | PRN

## 2018-01-21 MED ORDER — PROPOFOL 10 MG/ML IV BOLUS
INTRAVENOUS | Status: DC | PRN
  Administered 2018-01-21: 150 mg via INTRAVENOUS

## 2018-01-21 SURGICAL SUPPLY — 47 items
APPLIER CLIP ROT 10 11.4 M/L (STAPLE)
BLADE CLIPPER SURG (BLADE) ×3 IMPLANT
CANISTER SUCT 3000ML PPV (MISCELLANEOUS) ×3 IMPLANT
CHLORAPREP W/TINT 26ML (MISCELLANEOUS) ×3 IMPLANT
CLIP APPLIE ROT 10 11.4 M/L (STAPLE) IMPLANT
COVER SURGICAL LIGHT HANDLE (MISCELLANEOUS) ×3 IMPLANT
CUTTER FLEX LINEAR 45M (STAPLE) ×3 IMPLANT
DERMABOND ADVANCED (GAUZE/BANDAGES/DRESSINGS) ×2
DERMABOND ADVANCED .7 DNX12 (GAUZE/BANDAGES/DRESSINGS) ×1 IMPLANT
DRAPE WARM FLUID 44X44 (DRAPE) ×3 IMPLANT
ELECT REM PT RETURN 9FT ADLT (ELECTROSURGICAL) ×3
ELECTRODE REM PT RTRN 9FT ADLT (ELECTROSURGICAL) ×1 IMPLANT
ENDOLOOP SUT PDS II  0 18 (SUTURE)
ENDOLOOP SUT PDS II 0 18 (SUTURE) IMPLANT
GLOVE BIO SURGEON STRL SZ8 (GLOVE) ×3 IMPLANT
GLOVE BIOGEL PI IND STRL 7.0 (GLOVE) ×2 IMPLANT
GLOVE BIOGEL PI IND STRL 7.5 (GLOVE) ×1 IMPLANT
GLOVE BIOGEL PI IND STRL 8 (GLOVE) ×4 IMPLANT
GLOVE BIOGEL PI INDICATOR 7.0 (GLOVE) ×4
GLOVE BIOGEL PI INDICATOR 7.5 (GLOVE) ×2
GLOVE BIOGEL PI INDICATOR 8 (GLOVE) ×8
GLOVE ECLIPSE 6.5 STRL STRAW (GLOVE) ×3 IMPLANT
GLOVE SURG SS PI 7.0 STRL IVOR (GLOVE) ×6 IMPLANT
GOWN STRL REUS W/ TWL LRG LVL3 (GOWN DISPOSABLE) ×4 IMPLANT
GOWN STRL REUS W/ TWL XL LVL3 (GOWN DISPOSABLE) ×1 IMPLANT
GOWN STRL REUS W/TWL LRG LVL3 (GOWN DISPOSABLE) ×8
GOWN STRL REUS W/TWL XL LVL3 (GOWN DISPOSABLE) ×2
KIT BASIN OR (CUSTOM PROCEDURE TRAY) ×3 IMPLANT
KIT ROOM TURNOVER OR (KITS) ×3 IMPLANT
NEEDLE HYPO 25GX1X1/2 BEV (NEEDLE) ×3 IMPLANT
NS IRRIG 1000ML POUR BTL (IV SOLUTION) ×3 IMPLANT
PAD ARMBOARD 7.5X6 YLW CONV (MISCELLANEOUS) ×6 IMPLANT
POUCH SPECIMEN RETRIEVAL 10MM (ENDOMECHANICALS) ×3 IMPLANT
RELOAD STAPLE TA45 3.5 REG BLU (ENDOMECHANICALS) ×3 IMPLANT
SCISSORS LAP 5X35 DISP (ENDOMECHANICALS) ×3 IMPLANT
SET IRRIG TUBING LAPAROSCOPIC (IRRIGATION / IRRIGATOR) ×3 IMPLANT
SHEARS HARMONIC ACE PLUS 36CM (ENDOMECHANICALS) ×3 IMPLANT
SPECIMEN JAR SMALL (MISCELLANEOUS) ×3 IMPLANT
SUT MON AB 4-0 PC3 18 (SUTURE) ×3 IMPLANT
TOWEL OR 17X24 6PK STRL BLUE (TOWEL DISPOSABLE) ×3 IMPLANT
TOWEL OR 17X26 10 PK STRL BLUE (TOWEL DISPOSABLE) IMPLANT
TRAY FOLEY CATH SILVER 16FR (SET/KITS/TRAYS/PACK) ×3 IMPLANT
TRAY LAPAROSCOPIC MC (CUSTOM PROCEDURE TRAY) ×3 IMPLANT
TROCAR XCEL BLADELESS 5X75MML (TROCAR) ×6 IMPLANT
TROCAR XCEL BLUNT TIP 100MML (ENDOMECHANICALS) ×3 IMPLANT
TUBING INSUFFLATION (TUBING) ×3 IMPLANT
WATER STERILE IRR 1000ML POUR (IV SOLUTION) ×3 IMPLANT

## 2018-01-21 NOTE — Anesthesia Postprocedure Evaluation (Signed)
Anesthesia Post Note  Patient: Xavier [name]  Procedure(s) Performed: APPENDECTOMY LAPAROSCOPIC (N/A Abdomen)     Patient location during evaluation: PACU Anesthesia Type: General Level of consciousness: awake and alert Pain management: pain level controlled Vital Signs Assessment: post-procedure vital signs reviewed and stable Respiratory status: spontaneous breathing, nonlabored ventilation and respiratory function stable Cardiovascular status: blood pressure returned to baseline and stable Postop Assessment: no apparent nausea or vomiting Anesthetic complications: no    Last Vitals:  Vitals:   01/21/18 1122 01/21/18 1138  BP: 123/77 125/72  Pulse: 85 86  Resp: 15 16  Temp: 36.7 C (!) 36.3 C  SpO2: 93% 95%    Last Pain:  Vitals:   01/21/18 1138  TempSrc: Oral  PainSc:                  Lowella CurbWarren Ray Miller

## 2018-01-21 NOTE — Transfer of Care (Signed)
Immediate Anesthesia Transfer of Care Note  Patient: Waynette ButteryKeith L Martinezlopez  Procedure(s) Performed: APPENDECTOMY LAPAROSCOPIC (N/A Abdomen)  Patient Location: PACU  Anesthesia Type:General  Level of Consciousness: awake, alert  and oriented  Airway & Oxygen Therapy: Patient connected to face mask oxygen  Post-op Assessment: Post -op Vital signs reviewed and stable  Post vital signs: stable  Last Vitals:  Vitals:   01/20/18 2315 01/21/18 0513  BP: (!) 150/85 129/71  Pulse: 78 82  Resp: 18 19  Temp: 36.7 C 37.6 C  SpO2: 98% 99%    Last Pain:  Vitals:   01/21/18 0800  TempSrc:   PainSc: 3          Complications: No apparent anesthesia complications

## 2018-01-21 NOTE — Care Management Note (Signed)
Case Management Note  Patient Details  Name: Xavier [name] MRN: [id number] Date of Birth: 08/07/1966  Subjective/Objective:                    Action/Plan:  Continue to follow for discharge needs. [facility] Expected Discharge Date:                  Expected Discharge Plan:  Home/Self Care  In-House Referral:     Discharge planning Services  [facility]  Post Acute Care Choice:    Choice offered to:     DME Arranged:    DME Agency:     HH Arranged:    HH Agency:     Status of Service:  In process, will continue to follow  If discussed at Long Length of Stay Meetings, dates discussed:    Additional Comments:  Kingsley PlanWile, Crystallee Werden Marie, RN 01/21/2018, 11:55 AM

## 2018-01-21 NOTE — Anesthesia Procedure Notes (Signed)
Procedure Name: Intubation Date/Time: 01/21/2018 9:32 AM Performed by: Lynda Rainwater, MD Pre-anesthesia Checklist: Patient identified, Suction available, Patient being monitored and Emergency Drugs available Patient Re-evaluated:Patient Re-evaluated prior to induction Oxygen Delivery Method: Circle system utilized Preoxygenation: Pre-oxygenation with 100% oxygen Induction Type: IV induction Ventilation: Mask ventilation without difficulty Laryngoscope Size: Mac and 4 Grade View: Grade III Tube type: Oral Tube size: 7.5 mm Number of attempts: 2 Airway Equipment and Method: Bougie stylet Placement Confirmation: positive ETCO2 and breath sounds checked- equal and bilateral Secured at: 22 cm Tube secured with: Tape Dental Injury: Teeth and Oropharynx as per pre-operative assessment  Difficulty Due To: Difficulty was anticipated, Difficult Airway- due to reduced neck mobility, Difficult Airway- due to dentition and Difficult Airway- due to anterior larynx Future Recommendations: Recommend- induction with short-acting agent, and alternative techniques readily available

## 2018-01-21 NOTE — Anesthesia Preprocedure Evaluation (Signed)
Anesthesia Evaluation  Patient identified by MRN, date of birth, ID band Patient awake    Reviewed: Allergy & Precautions, NPO status , Patient's Chart, lab work & pertinent test results  Airway Mallampati: II  TM Distance: >3 FB Neck ROM: Full    Dental no notable dental hx.    Pulmonary neg pulmonary ROS, Current Smoker,    Pulmonary exam normal breath sounds clear to auscultation       Cardiovascular negative cardio ROS Normal cardiovascular exam Rhythm:Regular Rate:Normal     Neuro/Psych negative neurological ROS  negative psych ROS   GI/Hepatic negative GI ROS, Neg liver ROS,   Endo/Other  negative endocrine ROS  Renal/GU negative Renal ROS  negative genitourinary   Musculoskeletal negative musculoskeletal ROS (+)   Abdominal   Peds negative pediatric ROS (+)  Hematology negative hematology ROS (+)   Anesthesia Other Findings   Reproductive/Obstetrics negative OB ROS                             Anesthesia Physical Anesthesia Plan  ASA: II  Anesthesia Plan: General   Post-op Pain Management:    Induction: Intravenous  PONV Risk Score and Plan: 1 and Ondansetron  Airway Management Planned: Oral ETT  Additional Equipment:   Intra-op Plan:   Post-operative Plan: Extubation in OR  Informed Consent: I have reviewed the patients History and Physical, chart, labs and discussed the procedure including the risks, benefits and alternatives for the proposed anesthesia with the patient or authorized representative who has indicated his/her understanding and acceptance.     Dental advisory given  Plan Discussed with: CRNA  Anesthesia Plan Comments:         Anesthesia Quick Evaluation  

## 2018-01-21 NOTE — Interval H&P Note (Signed)
History and Physical Interval Note:  01/21/2018 8:47 AM  Xavier ButteryKeith L Mcclane  has presented today for surgery, with the diagnosis of Appendicitis  The various methods of treatment have been discussed with the patient and family. After consideration of risks, benefits and other options for treatment, the patient has consented to  Procedure(s): APPENDECTOMY LAPAROSCOPIC (N/A) as a surgical intervention .  The patient's history has been reviewed, patient examined, no change in status, stable for surgery.  I have reviewed the patient's chart and labs.  Questions were answered to the patient's satisfaction.     Taesha Goodell A Sheehan Stacey

## 2018-01-21 NOTE — Progress Notes (Signed)
[id number] Patient taken down to OR for lap appy. Report given to Sam.

## 2018-01-21 NOTE — Op Note (Signed)
Appendectomy, Lap, Procedure Note  Indications: The patient presented with a history of right-sided abdominal pain. A CT` revealed findings consistent with acute appendicitis. The procedure has been discussed with the patient.  Alternative therapies have been discussed with the patient.  Operative risks include bleeding,  Infection,  Organ injury,  Nerve injury,  Blood vessel injury,  DVT,  Pulmonary embolism,  Death,  And possible reoperation.  Medical management risks include worsening of present situation.  The success of the procedure is 50 -90 % at treating patients symptoms.  The patient understands and agrees to proceed.  Pre-operative Diagnosis: Acute appendicitis without mention of peritonitis  Post-operative Diagnosis: Acute appendicitis with peritoneal abscess  Surgeon: Clovis Puhomas A Concetta Guion   Assistants: or staff  Anesthesia: General endotracheal anesthesia and Local anesthesia 0.25.% bupivacaine, with epinephrine  ASA Class: 1, 2  Procedure Details  The patient was seen again in the Holding Room. The risks, benefits, complications, treatment options, and expected outcomes were discussed with the patient and/or family. The possibilities of reaction to medication, pulmonary aspiration, perforation of viscus, bleeding, recurrent infection, finding a normal appendix, the need for additional procedures, failure to diagnose a condition, and creating a complication requiring transfusion or operation were discussed. There was concurrence with the proposed plan and informed consent was obtained. The site of surgery was properly noted/marked. The patient was taken to Operating Room, identified as Xavier [name] and the procedure verified as Appendectomy. A Time Out was held and the above information confirmed.  The patient was placed in the supine position and general anesthesia was induced, along with placement of orogastric tube, Venodyne boots, and a Foley catheter. The abdomen was prepped and  draped in a sterile fashion. A one centimeter infraumbilical incision was made and the peritoneal cavity was accessed using the OPEN  technique. The pneumoperitoneum was then established to steady pressure of 12 mmHg. A 12 mm port was placed through the umbilical incision. Additional 5 mm cannulas then placed in the left lower quadrant of the abdomen and half way between the umbilicus and pubic symphysis under direct vision. A careful evaluation of the entire abdomen was carried out. The patient was placed in Trendelenburg and left lateral decubitus position. The small intestines were retracted in the cephalad and left lateral direction away from the pelvis and right lower quadrant. The patient was found to have an enlarged and inflamed appendix that was extending into the pelvis. There was no evidence of perforation.  The appendix was carefully dissected. A window was made in the mesoappendix at the base of the appendix. A harmonic scalpel was used across the mesoappendix. The appendix was ruptured and there was a RLQ abscess cavity.  The appendix was divided at its base using an endo-GIA stapler. Minimal appendiceal stump was left in place. There was no evidence of bleeding, leakage, or complication after division of the appendix. Irrigation was also performed and irrigate suctioned from the abdomen as well.  The umbilical port site was closed using 0 vicryl pursestring sutures fashion at the level of the fascia. The trocar site skin wounds were closed with 4 O monocryl.  Instrument, sponge, and needle counts were correct at the conclusion of the case.   Findings: The appendix was found to be inflamed. There were signs of necrosis.  There was perforation. There was abscess formation.  Estimated Blood Loss:  less than 100 mL         Drains: none  Total IV Fluids: per anesthesia record         Specimens: appendix remnant         Complications:  None; patient tolerated the procedure well.          Disposition: PACU - hemodynamically stable.         Condition: stable

## 2018-01-21 NOTE — Progress Notes (Signed)
1138 Received pt from PACU. Alert oriented x4. Lap sites clean, dry, and intact.

## 2018-01-22 ENCOUNTER — Encounter (HOSPITAL_COMMUNITY): Payer: Self-pay | Admitting: Surgery

## 2018-01-22 LAB — CBC
HCT: 40.4 % (ref 39.0–52.0)
HEMOGLOBIN: 13.8 g/dL (ref 13.0–17.0)
MCH: 34.6 pg — AB (ref 26.0–34.0)
MCHC: 34.2 g/dL (ref 30.0–36.0)
MCV: 101.3 fL — ABNORMAL HIGH (ref 78.0–100.0)
Platelets: 179 10*3/uL (ref 150–400)
RBC: 3.99 MIL/uL — ABNORMAL LOW (ref 4.22–5.81)
RDW: 12.5 % (ref 11.5–15.5)
WBC: 11.5 10*3/uL — ABNORMAL HIGH (ref 4.0–10.5)

## 2018-01-22 MED ORDER — CALCIUM CARBONATE ANTACID 500 MG PO CHEW
2.0000 | CHEWABLE_TABLET | Freq: Four times a day (QID) | ORAL | Status: DC | PRN
  Administered 2018-01-22: 400 mg via ORAL
  Filled 2018-01-22: qty 2

## 2018-01-22 MED ORDER — FOLIC ACID 1 MG PO TABS
1.0000 mg | ORAL_TABLET | Freq: Every day | ORAL | Status: DC
  Administered 2018-01-22 – 2018-01-27 (×6): 1 mg via ORAL
  Filled 2018-01-22 (×6): qty 1

## 2018-01-22 MED ORDER — THIAMINE HCL 100 MG/ML IJ SOLN: 100.0000 mg | Freq: Every day | INTRAMUSCULAR | Status: DC

## 2018-01-22 MED ORDER — LORAZEPAM 2 MG/ML IJ SOLN: 1.0000 mg | Freq: Four times a day (QID) | INTRAMUSCULAR | Status: AC | PRN

## 2018-01-22 MED ORDER — LORAZEPAM 1 MG PO TABS
1.0000 mg | ORAL_TABLET | Freq: Four times a day (QID) | ORAL | Status: AC | PRN
  Filled 2018-01-22: qty 1

## 2018-01-22 MED ORDER — PANTOPRAZOLE SODIUM 40 MG IV SOLR
40.0000 mg | INTRAVENOUS | Status: DC
  Administered 2018-01-22: 40 mg via INTRAVENOUS
  Filled 2018-01-22: qty 40

## 2018-01-22 MED ORDER — OXYCODONE-ACETAMINOPHEN 5-325 MG PO TABS
1.0000 | ORAL_TABLET | ORAL | Status: DC | PRN
  Administered 2018-01-22: 1 via ORAL
  Filled 2018-01-22 (×2): qty 1

## 2018-01-22 MED ORDER — ADULT MULTIVITAMIN W/MINERALS CH
1.0000 | ORAL_TABLET | Freq: Every day | ORAL | Status: DC
  Administered 2018-01-22 – 2018-01-27 (×6): 1 via ORAL
  Filled 2018-01-22 (×6): qty 1

## 2018-01-22 MED ORDER — VITAMIN B-1 100 MG PO TABS
100.0000 mg | ORAL_TABLET | Freq: Every day | ORAL | Status: DC
  Administered 2018-01-22 – 2018-01-27 (×6): 100 mg via ORAL
  Filled 2018-01-22 (×6): qty 1

## 2018-01-22 NOTE — Progress Notes (Signed)
[facility]/Trauma Progress Note  1 Day Post-Op   Assessment/Plan ETOH abuse - CIWA if pt is not discharged today  Perforated Appendicitis - S/P lap appy, Dr. Luisa Hartornett, 02/05  FEN: reg diet VTE: SCD's, lovenox ID: Rocephin 02/04>>   Flagyl 02/04>> Foley: none Follow up: [facility]   DISPO: pt needs to tolerate diet before discharge. Potential discharge this afternoon    LOS: 2 days    Subjective: CC: nausea and vomiting, abdominal pain  Pain is better. Nausea and vomiting overnight. Unable to eat. No fever, chills, CP, SOB. He is just not feeling well today.  Objective: Vital signs in last 24 hours: Temp:  [97.4 F (36.3 C)-98.8 F (37.1 C)] 98.7 F (37.1 C) (02/06 0550) Pulse Rate:  [72-101] 74 (02/06 0550) Resp:  [15-18] 18 (02/06 0550) BP: (123-158)/(62-86) 130/77 (02/06 0550) SpO2:  [92 %-99 %] 99 % (02/06 0550) Last BM Date: 01/20/18  Intake/Output from previous day: 02/05 0701 - 02/06 0700 In: 2982.5 [P.O.:780; I.V.:2002.5; IV Piggyback:200] Out: 1600 [Urine:1550; Blood:50] Intake/Output this shift: No intake/output data recorded.  PE: Gen:  Alert, NAD, pleasant, cooperative Card:  RRR, no M/G/R heard Pulm:  CTA, no W/R/R, rate and effort normal Abd: Soft, not distended, hypoactive BS, incisions with glue intact with mild surrounding erythema and without drainage. generalized TTP with mild guarding Skin: no rashes noted, warm and dry   Anti-infectives: Anti-infectives (From admission, onward)   Start     Dose/Rate Route Frequency Ordered Stop   01/21/18 2200  cefTRIAXone (ROCEPHIN) 2 g in dextrose 5 % 50 mL IVPB     2 g 100 mL/hr over 30 Minutes Intravenous Every 24 hours 01/20/18 2343     01/21/18 0845  cefoTEtan in Dextrose 5% (CEFOTAN) IVPB 2 g     2 g Intravenous On call to O.R. 01/21/18 [id number] 01/21/18 0950   01/21/18 0600  metroNIDAZOLE (FLAGYL) IVPB 500 mg     500 mg 100 mL/hr over 60 Minutes Intravenous Every 8 hours 01/20/18  2343     01/20/18 2115  cefTRIAXone (ROCEPHIN) 2 g in dextrose 5 % 50 mL IVPB     2 g 100 mL/hr over 30 Minutes Intravenous  Once 01/20/18 2110 01/20/18 2217   01/20/18 2115  metroNIDAZOLE (FLAGYL) IVPB 500 mg     500 mg 100 mL/hr over 60 Minutes Intravenous  Once 01/20/18 2110 01/20/18 2225      Lab Results:  Recent Labs    01/20/18 1925  WBC 18.0*  HGB 17.3*  HCT 49.7  PLT 232   BMET Recent Labs    01/20/18 1925  NA 137  K 3.9  CL 99*  CO2 23  GLUCOSE 99  BUN 10  CREATININE 1.07  CALCIUM 9.6   PT/INR No results for input(s): LABPROT, INR in the last 72 hours. CMP     Component Value Date/Time   NA 137 01/20/2018 1925   K 3.9 01/20/2018 1925   CL 99 (L) 01/20/2018 1925   CO2 23 01/20/2018 1925   GLUCOSE 99 01/20/2018 1925   BUN 10 01/20/2018 1925   CREATININE 1.07 01/20/2018 1925   CREATININE 0.90 02/03/2014 1444   CALCIUM 9.6 01/20/2018 1925   PROT 8.1 01/20/2018 1925   ALBUMIN 4.0 01/20/2018 1925   AST 17 01/20/2018 1925   ALT 15 (L) 01/20/2018 1925   ALKPHOS 70 01/20/2018 1925   BILITOT 1.3 (H) 01/20/2018 1925   GFRNONAA >60 01/20/2018 1925   GFRAA >60 01/20/2018 1925  Lipase     Component Value Date/Time   LIPASE 25 01/20/2018 1925    Studies/Results: Ct Abdomen Pelvis W Contrast  Result Date: 01/20/2018 CLINICAL DATA:  Acute generalized abdominal pain, RIGHT lower quadrant pain with nausea and diarrhea for 1 day, similar symptoms of lesser severity 2 weeks ago which resolved, smoker EXAM: CT ABDOMEN AND PELVIS WITH CONTRAST TECHNIQUE: Multidetector CT imaging of the abdomen and pelvis was performed using the standard protocol following bolus administration of intravenous contrast. Sagittal and coronal MPR images reconstructed from axial data set. CONTRAST:  100 ML ISOVUE-300 IOPAMIDOL (ISOVUE-300) INJECTION 61% IV. No oral contrast. COMPARISON:  None FINDINGS: Lower chest: Lung bases clear Hepatobiliary: Gallbladder and liver normal appearance  Pancreas: Normal appearance Spleen: Normal appearance Adrenals/Urinary Tract: Adrenal glands, kidneys, ureters, and bladder normal appearance Stomach/Bowel: Stomach and colon normal appearance. Mild thickening of the terminal ileal wall, reactive. Changes of acute appendicitis are present: Appendix: Location: Caudal to cecal tip Diameter: 10 mm Appendicolith: None Mucosal hyper-enhancement: Present Extraluminal gas: Absent Periappendiceal collection: Edema without discrete periappendiceal fluid collection Vascular/Lymphatic: Unremarkable Reproductive: Minimal prostatic enlargement 4.4 x 3.3 cm image 82. Seminal vesicles unremarkable. Other: No free air or free fluid.  No hernia. 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 No acute osseous findings. IMPRESSION: Acute appendicitis as above. Electronically Signed   By: Ulyses Southward M.D.   On: 01/20/2018 20:26      Jerre Simon , [facility] 01/22/2018, 8:34 AM Pager: [phone] Consults: [phone] Mon-Fri 7:00 am-4:30 pm Sat-Sun 7:00 am-11:30 am

## 2018-01-23 MED ORDER — MORPHINE SULFATE (PF) 4 MG/ML IV SOLN
2.0000 mg | INTRAVENOUS | Status: DC | PRN
  Administered 2018-01-23: 4 mg via INTRAVENOUS
  Administered 2018-01-23 (×2): 2 mg via INTRAVENOUS
  Administered 2018-01-24 – 2018-01-25 (×4): 4 mg via INTRAVENOUS
  Filled 2018-01-23 (×8): qty 1

## 2018-01-23 MED ORDER — PANTOPRAZOLE SODIUM 40 MG PO TBEC
40.0000 mg | DELAYED_RELEASE_TABLET | Freq: Every day | ORAL | Status: DC
  Administered 2018-01-23 – 2018-01-26 (×4): 40 mg via ORAL
  Filled 2018-01-23 (×4): qty 1

## 2018-01-23 MED ORDER — METRONIDAZOLE 500 MG PO TABS
500.0000 mg | ORAL_TABLET | Freq: Three times a day (TID) | ORAL | Status: DC
  Administered 2018-01-23 – 2018-01-27 (×13): 500 mg via ORAL
  Filled 2018-01-23 (×13): qty 1

## 2018-01-23 MED ORDER — HYDROCODONE-ACETAMINOPHEN 7.5-325 MG PO TABS
1.0000 | ORAL_TABLET | Freq: Four times a day (QID) | ORAL | Status: DC | PRN
  Administered 2018-01-23 – 2018-01-26 (×10): 1 via ORAL
  Filled 2018-01-23 (×11): qty 1

## 2018-01-23 NOTE — Progress Notes (Signed)
[facility]/Trauma Progress Note  2 Days Post-Op   Assessment/Plan ETOH abuse - CIWA   Perforated Appendicitis - S/P lap appy, Dr. Luisa Hart, 02/05  FEN: reg diet VTE: SCD's, lovenox ID: Rocephin 02/04>>   Flagyl 02/04>> Foley: none Follow up: [facility]             DISPO: pt needs to tolerate diet before discharge.     LOS: 3 days    Subjective: CC: abdominal pain, bloating, nausea  No vomiting. Pt states the dilaudid makes him feel more nauseated. He denies flatus or BM. Still not feeling well. He has been ambulating.   Objective: Vital signs in last 24 hours: Temp:  [98.4 F (36.9 C)-98.8 F (37.1 C)] 98.8 F (37.1 C) (02/07 0521) Pulse Rate:  [72-78] 78 (02/07 0521) Resp:  [18-19] 19 (02/07 0521) BP: (141-149)/(76-86) 149/85 (02/07 0521) SpO2:  [96 %-98 %] 97 % (02/07 0521) Last BM Date: 01/20/18  Intake/Output from previous day: 02/06 0701 - 02/07 0700 In: 2280 [P.O.:580; I.V.:1600; IV Piggyback:100] Out: 900 [Urine:900] Intake/Output this shift: No intake/output data recorded.  PE: Gen:  Alert, NAD, pleasant, cooperative Card:  RRR, no M/G/R heard Pulm:  CTA, no W/R/R, rate and effort normal Abd: Soft, distended, hypoactive BS with tinkling, incisions with glue intact with improved mild surrounding erythema and without drainage. generalized TTP with guarding Skin: no rashes noted, warm and dry   Anti-infectives: Anti-infectives (From admission, onward)   Start     Dose/Rate Route Frequency Ordered Stop   01/23/18 1400  metroNIDAZOLE (FLAGYL) tablet 500 mg     500 mg Oral Every 8 hours 01/23/18 0722     01/21/18 2200  cefTRIAXone (ROCEPHIN) 2 g in dextrose 5 % 50 mL IVPB     2 g 100 mL/hr over 30 Minutes Intravenous Every 24 hours 01/20/18 2343     01/21/18 0845  cefoTEtan in Dextrose 5% (CEFOTAN) IVPB 2 g     2 g Intravenous On call to O.R. 01/21/18 1610 01/21/18 0950   01/21/18 0600  metroNIDAZOLE (FLAGYL) IVPB 500 mg  Status:   Discontinued     500 mg 100 mL/hr over 60 Minutes Intravenous Every 8 hours 01/20/18 2343 01/23/18 0721   01/20/18 2115  cefTRIAXone (ROCEPHIN) 2 g in dextrose 5 % 50 mL IVPB     2 g 100 mL/hr over 30 Minutes Intravenous  Once 01/20/18 2110 01/20/18 2217   01/20/18 2115  metroNIDAZOLE (FLAGYL) IVPB 500 mg     500 mg 100 mL/hr over 60 Minutes Intravenous  Once 01/20/18 2110 01/20/18 2225      Lab Results:  Recent Labs    01/20/18 1925 01/22/18 1034  WBC 18.0* 11.5*  HGB 17.3* 13.8  HCT 49.7 40.4  PLT 232 179   BMET Recent Labs    01/20/18 1925  NA 137  K 3.9  CL 99*  CO2 23  GLUCOSE 99  BUN 10  CREATININE 1.07  CALCIUM 9.6   PT/INR No results for input(s): LABPROT, INR in the last 72 hours. CMP     Component Value Date/Time   NA 137 01/20/2018 1925   K 3.9 01/20/2018 1925   CL 99 (L) 01/20/2018 1925   CO2 23 01/20/2018 1925   GLUCOSE 99 01/20/2018 1925   BUN 10 01/20/2018 1925   CREATININE 1.07 01/20/2018 1925   CREATININE 0.90 02/03/2014 1444   CALCIUM 9.6 01/20/2018 1925   PROT 8.1 01/20/2018 1925   ALBUMIN 4.0 01/20/2018 1925  AST 17 01/20/2018 1925   ALT 15 (L) 01/20/2018 1925   ALKPHOS 70 01/20/2018 1925   BILITOT 1.3 (H) 01/20/2018 1925   GFRNONAA >60 01/20/2018 1925   GFRAA >60 01/20/2018 1925   Lipase     Component Value Date/Time   LIPASE 25 01/20/2018 1925    Studies/Results: No results found.    Jerre SimonJessica L Lourdez Mcgahan , [facility] 01/23/2018, 8:31 AM Pager: [phone] Consults: [phone] Mon-Fri 7:00 am-4:30 pm Sat-Sun 7:00 am-11:30 am

## 2018-01-24 ENCOUNTER — Inpatient Hospital Stay (HOSPITAL_COMMUNITY): Payer: Self-pay

## 2018-01-24 LAB — CBC
HCT: 38.8 % — ABNORMAL LOW (ref 39.0–52.0)
Hemoglobin: 13.2 g/dL (ref 13.0–17.0)
MCH: 34.4 pg — AB (ref 26.0–34.0)
MCHC: 34 g/dL (ref 30.0–36.0)
MCV: 101 fL — ABNORMAL HIGH (ref 78.0–100.0)
PLATELETS: 236 10*3/uL (ref 150–400)
RBC: 3.84 MIL/uL — AB (ref 4.22–5.81)
RDW: 12.5 % (ref 11.5–15.5)
WBC: 8.3 10*3/uL (ref 4.0–10.5)

## 2018-01-24 NOTE — Progress Notes (Signed)
[facility]/Trauma Progress Note  3 Days Post-Op   Assessment/Plan ETOH abuse - CIWA   Perforated Appendicitis - S/P lap appy, Dr. Luisa Hart, 02/05  FEN:reg diet VTE: SCD's, lovenox ZO:[id number] 02/04>>Flagyl 02/04>>    WBC 8.3 02/08 Foley:none Follow up:[facility]  DISPO:pt needs to tolerate diet before discharge. Abd xray pending    LOS: 4 days    Subjective: CC: abdominal pain, bloating, nausea  Small amount of emesis overnight. He denies flatus or BM. Still not feeling well. He has been ambulating.   Objective: Vital signs in last 24 hours: Temp:  [98.4 F (36.9 C)] 98.4 F (36.9 C) (02/08 0449) Pulse Rate:  [72-87] 72 (02/08 0449) Resp:  [18] 18 (02/07 1331) BP: (153-161)/(77-93) 157/77 (02/08 0449) SpO2:  [95 %-99 %] 99 % (02/08 0449) Last BM Date: 01/20/18  Intake/Output from previous day: 02/07 0701 - 02/08 0700 In: 2790 [P.O.:240; I.V.:2500; IV Piggyback:50] Out: 1550 [Urine:1450; Emesis/NG output:100] Intake/Output this shift: No intake/output data recorded.  PE: Gen: Alert, NAD, pleasant, cooperative Card: RRR, no M/G/R heard Pulm: CTA, no W/R/R, rate andeffort normal Abd: Soft,distended,+BS,incisions with glue intact, generalized TTP with mild guarding Skin: no rashes noted, warm and dry   Anti-infectives: Anti-infectives (From admission, onward)   Start     Dose/Rate Route Frequency Ordered Stop   01/23/18 1400  metroNIDAZOLE (FLAGYL) tablet 500 mg     500 mg Oral Every 8 hours 01/23/18 0722     01/21/18 2200  cefTRIAXone (ROCEPHIN) 2 g in dextrose 5 % 50 mL IVPB     2 g 100 mL/hr over 30 Minutes Intravenous Every 24 hours 01/20/18 2343     01/21/18 0845  cefoTEtan in Dextrose 5% (CEFOTAN) IVPB 2 g     2 g Intravenous On call to O.R. 01/21/18 0981 01/21/18 0950   01/21/18 0600  metroNIDAZOLE (FLAGYL) IVPB 500 mg  Status:  Discontinued     500 mg 100 mL/hr over 60 Minutes Intravenous Every 8 hours  01/20/18 2343 01/23/18 0721   01/20/18 2115  cefTRIAXone (ROCEPHIN) 2 g in dextrose 5 % 50 mL IVPB     2 g 100 mL/hr over 30 Minutes Intravenous  Once 01/20/18 2110 01/20/18 2217   01/20/18 2115  metroNIDAZOLE (FLAGYL) IVPB 500 mg     500 mg 100 mL/hr over 60 Minutes Intravenous  Once 01/20/18 2110 01/20/18 2225      Lab Results:  Recent Labs    01/22/18 1034 01/24/18 0728  WBC 11.5* 8.3  HGB 13.8 13.2  HCT 40.4 38.8*  PLT 179 236   BMET No results for input(s): NA, K, CL, CO2, GLUCOSE, BUN, CREATININE, CALCIUM in the last 72 hours. PT/INR No results for input(s): LABPROT, INR in the last 72 hours. CMP     Component Value Date/Time   NA 137 01/20/2018 1925   K 3.9 01/20/2018 1925   CL 99 (L) 01/20/2018 1925   CO2 23 01/20/2018 1925   GLUCOSE 99 01/20/2018 1925   BUN 10 01/20/2018 1925   CREATININE 1.07 01/20/2018 1925   CREATININE 0.90 02/03/2014 1444   CALCIUM 9.6 01/20/2018 1925   PROT 8.1 01/20/2018 1925   ALBUMIN 4.0 01/20/2018 1925   AST 17 01/20/2018 1925   ALT 15 (L) 01/20/2018 1925   ALKPHOS 70 01/20/2018 1925   BILITOT 1.3 (H) 01/20/2018 1925   GFRNONAA >60 01/20/2018 1925   GFRAA >60 01/20/2018 1925   Lipase     Component Value Date/Time   LIPASE  25 01/20/2018 1925    Studies/Results: No results found.    Jerre SimonJessica L Onedia Vargus , [facility] 01/24/2018, 8:59 AM Pager: [phone] Consults: [phone] Mon-Fri 7:00 am-4:30 pm Sat-Sun 7:00 am-11:30 am

## 2018-01-25 LAB — CBC WITH DIFFERENTIAL/PLATELET
BASOS PCT: 0 %
Basophils Absolute: 0 10*3/uL (ref 0.0–0.1)
EOS ABS: 0.2 10*3/uL (ref 0.0–0.7)
Eosinophils Relative: 2 %
HEMATOCRIT: 38.6 % — AB (ref 39.0–52.0)
Hemoglobin: 13 g/dL (ref 13.0–17.0)
LYMPHS ABS: 1.2 10*3/uL (ref 0.7–4.0)
Lymphocytes Relative: 16 %
MCH: 33.9 pg (ref 26.0–34.0)
MCHC: 33.7 g/dL (ref 30.0–36.0)
MCV: 100.5 fL — ABNORMAL HIGH (ref 78.0–100.0)
MONO ABS: 0.9 10*3/uL (ref 0.1–1.0)
MONOS PCT: 12 %
Neutro Abs: 5 10*3/uL (ref 1.7–7.7)
Neutrophils Relative %: 70 %
Platelets: 263 10*3/uL (ref 150–400)
RBC: 3.84 MIL/uL — ABNORMAL LOW (ref 4.22–5.81)
RDW: 12.4 % (ref 11.5–15.5)
WBC: 7.2 10*3/uL (ref 4.0–10.5)

## 2018-01-25 LAB — BASIC METABOLIC PANEL
Anion gap: 12 (ref 5–15)
BUN: 5 mg/dL — ABNORMAL LOW (ref 6–20)
CALCIUM: 8.2 mg/dL — AB (ref 8.9–10.3)
CHLORIDE: 100 mmol/L — AB (ref 101–111)
CO2: 24 mmol/L (ref 22–32)
CREATININE: 0.67 mg/dL (ref 0.61–1.24)
GFR calc non Af Amer: >60 mL/min (ref >60)
GLUCOSE: 99 mg/dL (ref 65–99)
Potassium: 3.4 mmol/L — ABNORMAL LOW (ref 3.5–5.1)
Sodium: 136 mmol/L (ref 135–145)

## 2018-01-25 MED ORDER — METOCLOPRAMIDE HCL 5 MG/ML IJ SOLN
5.0000 mg | Freq: Three times a day (TID) | INTRAMUSCULAR | Status: DC | PRN
  Administered 2018-01-26: 5 mg via INTRAVENOUS
  Filled 2018-01-25: qty 2

## 2018-01-25 NOTE — Progress Notes (Signed)
4 Days Post-Op   Subjective/Chief Complaint: Complains of nausea. Vomited again overnight. Passing flatus   Objective: Vital signs in last 24 hours: Temp:  [98.3 F (36.8 C)-98.6 F (37 C)] 98.3 F (36.8 C) (02/09 0507) Pulse Rate:  [60-68] 60 (02/09 0507) Resp:  [16] 16 (02/09 0507) BP: (147-153)/(87-89) 150/87 (02/09 0507) SpO2:  [96 %-99 %] 96 % (02/09 0507) Last BM Date: 01/20/18  Intake/Output from previous day: 02/08 0701 - 02/09 0700 In: 1013.3 [P.O.:120; I.V.:843.3; IV Piggyback:50] Out: 1650 [Urine:1550; Emesis/NG output:100] Intake/Output this shift: No intake/output data recorded.  General appearance: alert and cooperative Resp: clear to auscultation bilaterally Cardio: regular rate and rhythm GI: soft, distended. good bs. incisions ok  Lab Results:  Recent Labs    01/22/18 1034 01/24/18 0728  WBC 11.5* 8.3  HGB 13.8 13.2  HCT 40.4 38.8*  PLT 179 236   BMET No results for input(s): NA, K, CL, CO2, GLUCOSE, BUN, CREATININE, CALCIUM in the last 72 hours. PT/INR No results for input(s): LABPROT, INR in the last 72 hours. ABG No results for input(s): PHART, HCO3 in the last 72 hours.  Invalid input(s): PCO2, PO2  Studies/Results: Dg Abd Portable 1v  Result Date: 01/24/2018 CLINICAL DATA:  Ileus, abdominal pain EXAM: PORTABLE ABDOMEN - 1 VIEW COMPARISON:  01/20/2018 FINDINGS: Mild diffuse gaseous distention of bowel, possibly mild ileus. No convincing evidence for obstruction. No free air organomegaly. IMPRESSION: Mild diffuse gaseous distention of bowel, likely mild ileus. Electronically Signed   By: Charlett NoseKevin  Dover M.D.   On: 01/24/2018 09:55    Anti-infectives: Anti-infectives (From admission, onward)   Start     Dose/Rate Route Frequency Ordered Stop   01/23/18 1400  metroNIDAZOLE (FLAGYL) tablet 500 mg     500 mg Oral Every 8 hours 01/23/18 0722     01/21/18 2200  cefTRIAXone (ROCEPHIN) 2 g in dextrose 5 % 50 mL IVPB     2 g 100 mL/hr over 30  Minutes Intravenous Every 24 hours 01/20/18 2343     01/21/18 0845  cefoTEtan in Dextrose 5% (CEFOTAN) IVPB 2 g     2 g Intravenous On call to O.R. 01/21/18 [id number] 01/21/18 0950   01/21/18 0600  metroNIDAZOLE (FLAGYL) IVPB 500 mg  Status:  Discontinued     500 mg 100 mL/hr over 60 Minutes Intravenous Every 8 hours 01/20/18 2343 01/23/18 0721   01/20/18 2115  cefTRIAXone (ROCEPHIN) 2 g in dextrose 5 % 50 mL IVPB     2 g 100 mL/hr over 30 Minutes Intravenous  Once 01/20/18 2110 01/20/18 2217   01/20/18 2115  metroNIDAZOLE (FLAGYL) IVPB 500 mg     500 mg 100 mL/hr over 60 Minutes Intravenous  Once 01/20/18 2110 01/20/18 2225      Assessment/Plan: s/p Procedure(s): APPENDECTOMY LAPAROSCOPIC (N/A) Advance diet  Will add reglan for ileus Check lytes Ambulate Continue rocephin and flagyl for perf appy  LOS: 5 days    TOTH III,PAUL S 01/25/2018

## 2018-01-26 MED ORDER — KCL IN DEXTROSE-NACL 20-5-0.9 MEQ/L-%-% IV SOLN
INTRAVENOUS | Status: DC
  Administered 2018-01-26 – 2018-01-27 (×3): via INTRAVENOUS
  Filled 2018-01-26 (×4): qty 1000

## 2018-01-26 NOTE — Progress Notes (Signed)
5 Days Post-Op   Subjective/Chief Complaint: Feels better. Passed some flatus and had a bm   Objective: Vital signs in last 24 hours: Temp:  [98.4 F (36.9 C)-98.9 F (37.2 C)] 98.6 F (37 C) (02/10 0500) Pulse Rate:  [66-72] 72 (02/10 0500) Resp:  [16-18] 18 (02/10 0500) BP: (159-162)/(82-87) 162/87 (02/10 0500) SpO2:  [97 %-99 %] 97 % (02/10 0500) Last BM Date: 01/25/18  Intake/Output from previous day: 02/09 0701 - 02/10 0700 In: 240 [P.O.:240] Out: 2100 [Urine:2100] Intake/Output this shift: No intake/output data recorded.  General appearance: alert and cooperative Resp: clear to auscultation bilaterally Cardio: regular rate and rhythm GI: softer, less distended. good bs  Lab Results:  Recent Labs    01/24/18 0728 01/25/18 0750  WBC 8.3 7.2  HGB 13.2 13.0  HCT 38.8* 38.6*  PLT 236 263   BMET Recent Labs    01/25/18 0750  NA 136  K 3.4*  CL 100*  CO2 24  GLUCOSE 99  BUN 5*  CREATININE 0.67  CALCIUM 8.2*   PT/INR No results for input([name]): LABPROT, INR in the last 72 hours. ABG No results for input([name]): PHART, HCO3 in the last 72 hours.  Invalid input([name]): PCO2, PO2  Studies/Results: Dg Abd Portable 1v  Result Date: 01/24/2018 CLINICAL DATA:  Ileus, abdominal pain EXAM: PORTABLE ABDOMEN - 1 VIEW COMPARISON:  01/20/2018 FINDINGS: Mild diffuse gaseous distention of bowel, possibly mild ileus. No convincing evidence for obstruction. No free air organomegaly. IMPRESSION: Mild diffuse gaseous distention of bowel, likely mild ileus. Electronically Signed   By: Charlett NoseKevin  Dover M.D.   On: 01/24/2018 09:55    Anti-infectives: Anti-infectives (From admission, onward)   Start     Dose/Rate Route Frequency Ordered Stop   01/23/18 1400  metroNIDAZOLE (FLAGYL) tablet 500 mg     500 mg Oral Every 8 hours 01/23/18 0722     01/21/18 2200  cefTRIAXone (ROCEPHIN) 2 g in dextrose 5 % 50 mL IVPB     2 g 100 mL/hr over 30 Minutes Intravenous Every 24 hours 01/20/18 2343      01/21/18 0845  cefoTEtan in Dextrose 5% (CEFOTAN) IVPB 2 g     2 g Intravenous On call to O.R. 01/21/18 [id number] 01/21/18 0950   01/21/18 0600  metroNIDAZOLE (FLAGYL) IVPB 500 mg  Status:  Discontinued     500 mg 100 mL/hr over 60 Minutes Intravenous Every 8 hours 01/20/18 2343 01/23/18 0721   01/20/18 2115  cefTRIAXone (ROCEPHIN) 2 g in dextrose 5 % 50 mL IVPB     2 g 100 mL/hr over 30 Minutes Intravenous  Once 01/20/18 2110 01/20/18 2217   01/20/18 2115  metroNIDAZOLE (FLAGYL) IVPB 500 mg     500 mg 100 mL/hr over 60 Minutes Intravenous  Once 01/20/18 2110 01/20/18 2225      Assessment/Plan: [name]/p Procedure([name]): APPENDECTOMY LAPAROSCOPIC (N/A) Advance diet  Wbc normal. Continue rocephin and flagyl for now Ileus improving. Continue reglan ambulate  LOS: 6 days    Xavier [name],Xavier [name] 01/26/2018

## 2018-01-27 LAB — CREATININE, SERUM
CREATININE: 0.8 mg/dL (ref 0.61–1.24)
GFR calc Af Amer: >60 mL/min (ref >60)
GFR calc non Af Amer: >60 mL/min (ref >60)

## 2018-01-27 MED ORDER — HYDROCODONE-ACETAMINOPHEN 7.5-325 MG PO TABS: 1.0000 | ORAL_TABLET | Freq: Four times a day (QID) | ORAL | 0 refills | Status: DC | PRN

## 2018-01-27 MED ORDER — DEXTROSE 5 % IV SOLN
2.0000 g | INTRAVENOUS | Status: DC
  Administered 2018-01-27: 2 g via INTRAVENOUS
  Filled 2018-01-27: qty 20

## 2018-01-27 MED ORDER — MECLIZINE HCL 25 MG PO TABS: 25.0000 mg | ORAL_TABLET | Freq: Two times a day (BID) | ORAL | 0 refills | Status: DC

## 2018-01-27 MED ORDER — MECLIZINE HCL 25 MG PO TABS
25.0000 mg | ORAL_TABLET | Freq: Two times a day (BID) | ORAL | Status: DC
  Administered 2018-01-27: 25 mg via ORAL
  Filled 2018-01-27 (×2): qty 1

## 2018-01-27 MED ORDER — SODIUM CHLORIDE 0.9 % IV SOLN: 2.0000 g | INTRAVENOUS | Status: DC

## 2018-01-27 NOTE — Progress Notes (Signed)
[facility] Progress Note  6 Days Post-Op   Assessment/Plan ETOH abuse - CIWA   Perforated Appendicitis - S/P lap appy, Dr. Luisa Hartornett, 02/05  FEN:reg diet VTE: SCD's, lovenox WG:NFAOZHYQ:Rocephin 02/04>>Flagyl 02/04>>    WBC 8.3 02/08 Foley:none Follow up:[facility]  DISPO:case mgnt consult for PCP for elevated BP, started pt on antivert for symptoms consistent with vertigo. Will see if antivert helps before discharge this afternoon. Pt ileus appears to have resolved.     LOS: 7 days    Subjective: CC: abdominal pain  Pt states when he sits up or turns his head quickly he gets dizzy and nauseated. He states intermittent episodes of dizziness over the last 3-4 months. No LOC. Last night pt had an episode of emesis. He is having flatus and BM's. Tolerating diet.   Objective: Vital signs in last 24 hours: Temp:  [98.2 F (36.8 C)-98.5 F (36.9 C)] 98.2 F (36.8 C) (02/11 0600) Pulse Rate:  [65-72] 65 (02/11 0600) Resp:  [17-18] 18 (02/11 0600) BP: (154-165)/(85-91) 154/85 (02/11 0600) SpO2:  [96 %-100 %] 96 % (02/11 0600) Last BM Date: 01/25/18  Intake/Output from previous day: 02/10 0701 - 02/11 0700 In: 1961.7 [P.O.:1190; I.V.:721.7; IV Piggyback:50] Out: 1975 [Urine:1975] Intake/Output this shift: Total I/O In: -  Out: 300 [Urine:300]  PE: Gen: Alert, NAD, pleasant, cooperative Card: RRR, no M/G/R heard Pulm: CTA, no W/R/R, rate andeffort normal Abd: Soft,not distended,+BS,incisions with glue intact, generalized TTP without guarding Skin: no rashes noted, warm and dry   Anti-infectives: Anti-infectives (From admission, onward)   Start     Dose/Rate Route Frequency Ordered Stop   01/27/18 0830  cefTRIAXone (ROCEPHIN) 2 g in dextrose 5 % 50 mL IVPB     2 g 100 mL/hr over 30 Minutes Intravenous Every 24 hours 01/27/18 0743     01/23/18 1400  metroNIDAZOLE (FLAGYL) tablet 500 mg     500 mg Oral Every 8 hours 01/23/18  0722     01/21/18 2200  cefTRIAXone (ROCEPHIN) 2 g in dextrose 5 % 50 mL IVPB  Status:  Discontinued     2 g 100 mL/hr over 30 Minutes Intravenous Every 24 hours 01/20/18 2343 01/27/18 0742   01/21/18 0845  cefoTEtan in Dextrose 5% (CEFOTAN) IVPB 2 g     2 g Intravenous On call to O.R. 01/21/18 [id number] 01/21/18 0950   01/21/18 0600  metroNIDAZOLE (FLAGYL) IVPB 500 mg  Status:  Discontinued     500 mg 100 mL/hr over 60 Minutes Intravenous Every 8 hours 01/20/18 2343 01/23/18 0721   01/20/18 2115  cefTRIAXone (ROCEPHIN) 2 g in dextrose 5 % 50 mL IVPB     2 g 100 mL/hr over 30 Minutes Intravenous  Once 01/20/18 2110 01/20/18 2217   01/20/18 2115  metroNIDAZOLE (FLAGYL) IVPB 500 mg     500 mg 100 mL/hr over 60 Minutes Intravenous  Once 01/20/18 2110 01/20/18 2225      Lab Results:  Recent Labs    01/25/18 0750  WBC 7.2  HGB 13.0  HCT 38.6*  PLT 263   BMET Recent Labs    01/25/18 0750  NA 136  K 3.4*  CL 100*  CO2 24  GLUCOSE 99  BUN 5*  CREATININE 0.67  CALCIUM 8.2*   PT/INR No results for input(s): LABPROT, INR in the last 72 hours. CMP     Component Value Date/Time   NA 136 01/25/2018 0750   K 3.4 (L) 01/25/2018 0750   CL  100 (L) 01/25/2018 0750   CO2 24 01/25/2018 0750   GLUCOSE 99 01/25/2018 0750   BUN 5 (L) 01/25/2018 0750   CREATININE 0.67 01/25/2018 0750   CREATININE 0.90 02/03/2014 1444   CALCIUM 8.2 (L) 01/25/2018 0750   PROT 8.1 01/20/2018 1925   ALBUMIN 4.0 01/20/2018 1925   AST 17 01/20/2018 1925   ALT 15 (L) 01/20/2018 1925   ALKPHOS 70 01/20/2018 1925   BILITOT 1.3 (H) 01/20/2018 1925   GFRNONAA >60 01/25/2018 0750   GFRAA >60 01/25/2018 0750   Lipase     Component Value Date/Time   LIPASE 25 01/20/2018 1925    Studies/Results: No results found.    Jerre Simon , [facility] 01/27/2018, 8:39 AM Pager: [phone] Consults: [phone] Mon-Fri 7:00 am-4:30 pm Sat-Sun 7:00 am-11:30 am

## 2018-01-27 NOTE — Discharge Summary (Signed)
[facility] Discharge Summary   Patient ID: Xavier [name] MRN: [id number] DOB/AGE: 54/01/1966 52 y.o.  Admit date: 01/20/2018 Discharge date: 01/27/2018  Admitting Diagnosis: appendicitis  Discharge Diagnosis Patient Active Problem List   Diagnosis Date Noted  . Appendicitis 01/20/2018    Consultants none  Imaging: No results found.  Procedures Dr. Luisa Hartornett (01/21/18) - Laparoscopic Appendectomy  Hospital Course:  Pt is a 52 year old male [facility].  Workup showed appendicitis.  Patient was admitted and underwent procedure listed above. Post-operative diagnosis was acute appendicitis with peritoneal abscess. Pt was placed on IV abx. Tolerated procedure well and was transferred to the floor. Pt's hospital course was complicated by an ileus. NGT was not required and the ileus resolved. Diet was advanced as tolerated. Pt was complaining of dizziness and nausea with moving head and sitting up. Started pt on Antivert which pt states helped.  On POD#6, the patient was voiding well, tolerating diet, ambulating well, pain well controlled, vital signs stable, incisions c/d/i and felt stable for discharge home.  Patient will follow up in our office in 2 weeks and knows to call with questions or concerns.  He will call to confirm appointment date/time. Pt was discharged with Antivert and [facility]. Pt has been instructed to see a PCP in regards to high BP and symptoms of vertigo.     Patient was discharged in good condition.  The [place] Substance controlled database was reviewed prior to prescribing narcotic pain medication to this patient.  Physical Exam: Gen: Alert, NAD, pleasant, cooperative Card: RRR, no M/G/R heard Pulm: CTA, no W/R/R, rate andeffort normal Abd: Soft,not distended,+BS,incisions with glue intact,generalized TTP withoutguarding Skin: no rashes noted,  warm and dry  Allergies as of 01/27/2018   No Known Allergies     Medication List    STOP taking these medications   cyclobenzaprine 10 MG tablet Commonly known as:  FLEXERIL     TAKE these medications   HYDROcodone-acetaminophen 7.5-325 MG tablet Commonly known as:  NORCO Take 1 tablet by mouth every 6 (six) hours as needed for moderate pain.   meclizine 25 MG tablet Commonly known as:  ANTIVERT Take 1 tablet (25 mg total) by mouth 2 (two) times daily.        Follow-up Information    [facility] Follow up.   Why:  February 05, 2018 at 2:30 pm Contact information: [address] [place] [phone]       [facility] [place] Call.   Specialty:  General Surgery Why:  We are working on a follow up appoitnment for you. Please call our office to see when your appointment date and time is.  Contact information: [address] [place] [id number] [phone]          Signed: Joyce CopaJessica L [facility] 01/27/2018, 3:53 PM Pager: [phone] Consults: [phone] Mon-Fri 7:00 am-4:30 pm Sat-Sun 7:00 am-11:30 am

## 2018-01-27 NOTE — Care Management Note (Signed)
Case Management Note  Patient Details  Name: Xavier [name] MRN: [id number] Date of Birth: 05/02/1966  Subjective/Objective:                    Action/Plan:  [facility] for February 05, 2018 at 2:30 pm. Provided contact information.  Provided and explained MATCH letter. Over Ride for 14 days of hydrocodone 7.5/325mg .  Patient voiced understanding to all of above. Expected Discharge Date:                  Expected Discharge Plan:  Home/Self Care  In-House Referral:  PCP / [facility], Financial Counselor  Discharge planning Services  CM Consult, MATCH Program, [facility], Medication Assistance  Post Acute Care Choice:  NA Choice offered to:  Patient  DME Arranged:  N/A DME Agency:  NA  HH Arranged:  NA HH Agency:  NA  Status of Service:  Completed, signed off  If discussed at Long Length of Stay Meetings, dates discussed:    Additional Comments:  Kingsley PlanWile, Taji Barretto Marie, RN 01/27/2018, 11:00 AM

## 2018-01-27 NOTE — Discharge Instructions (Signed)
Please arrive at least 30 min before your appointment to complete your check in paperwork.  If you are unable to arrive 30 min prior to your appointment time we may have to cancel or reschedule you. ° °LAPAROSCOPIC SURGERY: POST OP INSTRUCTIONS  °1. DIET: Follow a light bland diet the first 24 hours after arrival home, such as soup, liquids, crackers, etc. Be sure to include lots of fluids daily. Avoid fast food or heavy meals as your are more likely to get nauseated. Eat a low fat the next few days after surgery.  °2. Take your usually prescribed home medications unless otherwise directed. °3. PAIN CONTROL:  °1. Pain is best controlled by a usual combination of three different methods TOGETHER:  °1. Ice/Heat °2. Over the counter pain medication °3. Prescription pain medication °2. Most patients will experience some swelling and bruising around the incisions. Ice packs or heating pads (30-60 minutes up to 6 times a day) will help. Use ice for the first few days to help decrease swelling and bruising, then switch to heat to help relax tight/sore spots and speed recovery. Some people prefer to use ice alone, heat alone, alternating between ice & heat. Experiment to what works for you. Swelling and bruising can take several weeks to resolve.  °3. It is helpful to take an over-the-counter pain medication regularly for the first few weeks. Choose one of the following that works best for you:  °1. Naproxen (Aleve, etc) Two 220mg tabs twice a day °2. Ibuprofen (Advil, etc) Three 200mg tabs four times a day (every meal & bedtime) °3. Acetaminophen (Tylenol, etc) 500-650mg four times a day (every meal & bedtime) °4. A prescription for pain medication (such as oxycodone, hydrocodone, etc) should be given to you upon discharge. Take your pain medication as prescribed.  °1. If you are having problems/concerns with the prescription medicine (does not control pain, nausea, vomiting, rash, itching, etc), please call us [phone] to see if we need to switch you to a different pain medicine that will work better for you and/or control your side effect better. °2. If you need a refill on your pain medication, please contact your pharmacy. They will contact our office to request authorization. Prescriptions will not be filled after 5 pm or on week-ends. °4. Avoid getting constipated. Between the surgery and the pain medications, it is common to experience some constipation. Increasing fluid intake and taking a fiber supplement (such as Metamucil, Citrucel, FiberCon, MiraLax, etc) 1-2 times a day regularly will usually help prevent this problem from occurring. A mild laxative (prune juice, Milk of Magnesia, MiraLax, etc) should be taken according to package directions if there are no bowel movements after 48 hours.  °5. Watch out for diarrhea. If you have many loose bowel movements, simplify your diet to bland foods & liquids for a few days. Stop any stool softeners and decrease your fiber supplement. Switching to mild anti-diarrheal medications (Kayopectate, Pepto Bismol) can help. If this worsens or does not improve, please call us. °6. Wash / shower every day. You may shower over the dressings as they are waterproof. Continue to shower over incision(s) after the dressing is off. °7. Remove your waterproof bandages 5 days after surgery. You may leave the incision open to air. You may replace a dressing/Band-Aid to cover the incision for comfort if you wish.  °8. ACTIVITIES as tolerated:  °1. You may resume regular (light) daily activities beginning the next day--such as daily self-care, walking, climbing stairs--gradually   increasing activities as tolerated. If you can walk 30 minutes without difficulty, it is safe to try more intense activity such as jogging, treadmill, bicycling, low-impact aerobics, swimming, etc. 2. Save the most intensive and strenuous activity for last such as sit-ups, heavy lifting, contact sports, etc Refrain  from any heavy lifting (>20lbs) or straining for 4 weeks  3. DO NOT PUSH THROUGH PAIN. Let pain be your guide: If it hurts to do something, don't do it. Pain is your body warning you to avoid that activity for another week until the pain goes down. 4. You may drive when you are no longer taking prescription pain medication, you can comfortably wear a seatbelt, and you can safely maneuver your car and apply brakes. 5. You may have sexual intercourse when it is comfortable.  9. FOLLOW UP in our office  1. Please call [facility] at [phone] to set up an appointment to see your surgeon in the office for a follow-up appointment approximately 2-3 weeks after your surgery. 2. Make sure that you call for this appointment the day you arrive home to insure a convenient appointment time.      10. IF YOU HAVE DISABILITY OR FAMILY LEAVE FORMS, BRING THEM TO THE               OFFICE FOR PROCESSING.   WHEN TO CALL US [phone]:  1. Poor pain control 2. Reactions / problems with new medications (rash/itching, nausea, etc)  3. Fever over 101.5 F (38.5 C) 4. Inability to urinate 5. Nausea and/or vomiting 6. Worsening swelling or bruising 7. Continued bleeding from incision. 8. Increased pain, redness, or drainage from the incision  The clinic staff is available to answer your questions during regular business hours (8:30am-5pm). Please dont hesitate to call and ask to speak to one of our nurses for clinical concerns.  If you have a medical emergency, go to the nearest emergency room or call 911.  A surgeon from [facility] is always on call at the [facility]   [facility] [place]  [address], [address], [place], [place] [address] ?  MAIN: [phone] ? TOLL FREE: [phone] ?  FAX [phone]  [email or web address]   Vertigo Vertigo means that you feel like you are moving when you are not. Vertigo can also make you feel like things around you are  moving when they are not. This feeling can come and go at any time. Vertigo often goes away on its own. Follow these instructions at home:  Avoid making fast movements.  Avoid driving.  Avoid using heavy machinery.  Avoid doing any task or activity that might cause danger to you or other people if you would have a vertigo attack while you are doing it.  Sit down right away if you feel dizzy or have trouble with your balance.  Take over-the-counter and prescription medicines only as told by your doctor.  Follow instructions from your doctor about which positions or movements you should avoid.  Drink enough fluid to keep your pee (urine) clear or pale yellow.  Keep all follow-up visits as told by your doctor. This is important. Contact a doctor if:  Medicine does not help your vertigo.  You have a fever.  Your problems get worse or you have new symptoms.  Your family or friends see changes in your behavior.  You feel sick to your stomach (nauseous) or you throw up (vomit).  You have a pins and needles feeling  or you are numb in part of your body. Get help right away if:  You have trouble moving or talking.  You are always dizzy.  You pass out (faint).  You get very bad headaches.  You feel weak or have trouble using your hands, arms, or legs.  You have changes in your hearing.  You have changes in your seeing (vision).  You get a stiff neck.  Bright light starts to bother you. This information is not intended to replace advice given to you by your health care provider. Make sure you discuss any questions you have with your health care provider. Document Released: 09/11/2008 Document Revised: 05/10/2016 Document Reviewed: 03/28/2015 Elsevier Interactive Patient Education  [employer].

## 2018-01-27 NOTE — Progress Notes (Signed)
Patient discharged to home with instructions and prescriptions. 

## 2018-02-05 ENCOUNTER — Inpatient Hospital Stay: Payer: PRIVATE HEALTH INSURANCE | Admitting: Internal Medicine

## 2018-03-15 IMAGING — CT CT ABD-PELV W/ CM
2 of 5 series · 16 of 46 positions shown, 18 images · IV contrast (iopamidol)
Comparison: None

CLINICAL DATA: Acute generalized abdominal pain, RIGHT lower
quadrant pain with nausea and diarrhea for 1 day, similar symptoms
of lesser severity 2 weeks ago which resolved, smoker

EXAM:
CT ABDOMEN AND PELVIS WITH CONTRAST
TECHNIQUE: Multidetector CT imaging of the abdomen and pelvis was performed
using the standard protocol following bolus administration of
intravenous contrast. Sagittal and coronal MPR images reconstructed
from axial data set.
CONTRAST:  100 ML [id number] IOPAMIDOL ([id number]) INJECTION 61%
IV. No oral contrast.

[Series 3: a/p w/ 5mm · axial · 0.74mm/px · z∈[+751,+1166]mm · 13 of 95 slices shown, 15 images]
[im 6/95  soft-tissue]
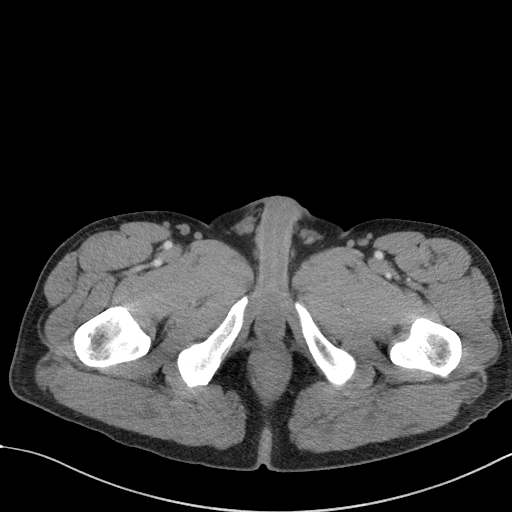
[im 6/95  bone]
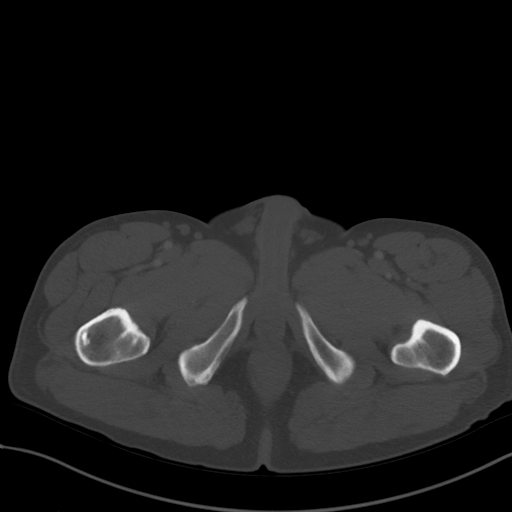
[im 12/95  soft-tissue]
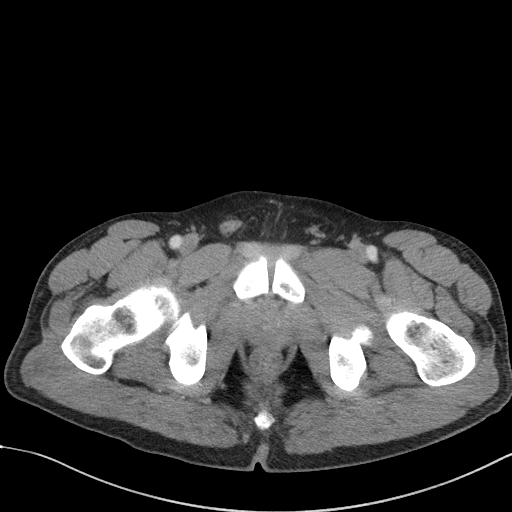
[im 23/95  soft-tissue]
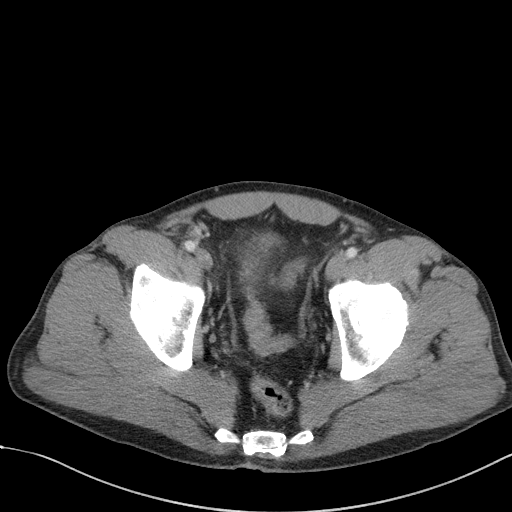
[im 28/95  soft-tissue]
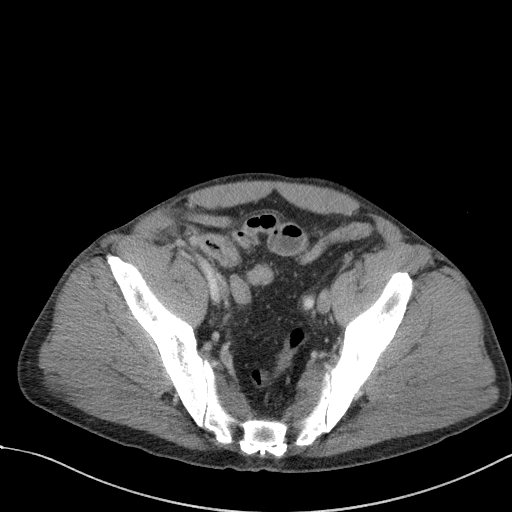
[im 34/95  soft-tissue]
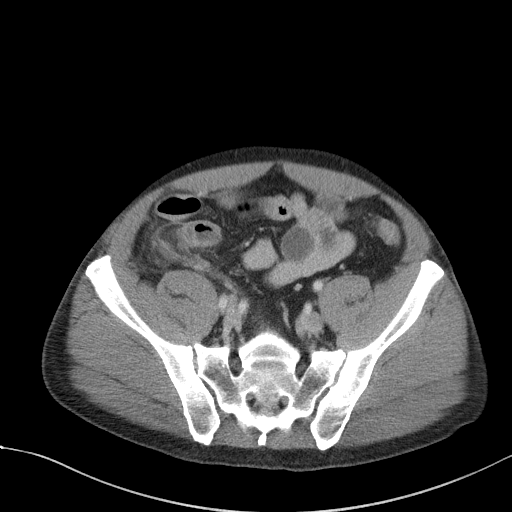
[im 39/95  soft-tissue]
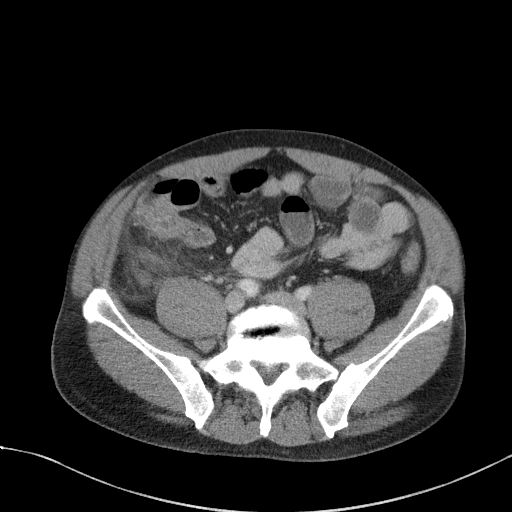
[im 50/95  soft-tissue]
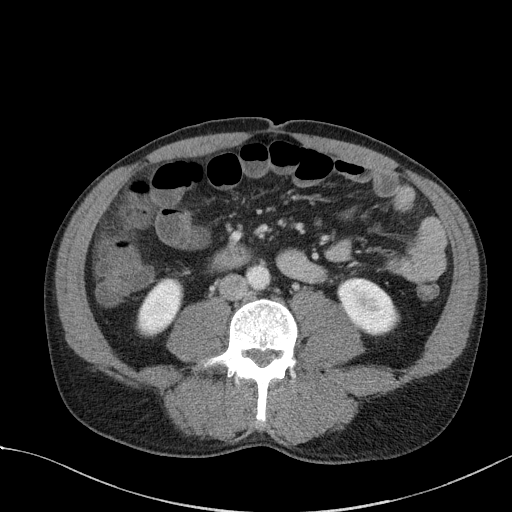
[im 56/95  soft-tissue]
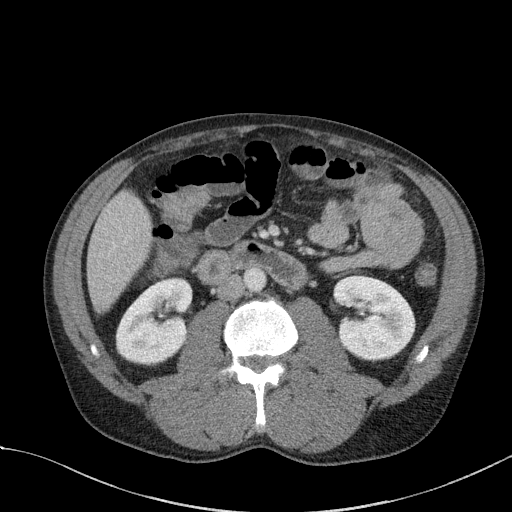
[im 61/95  soft-tissue]
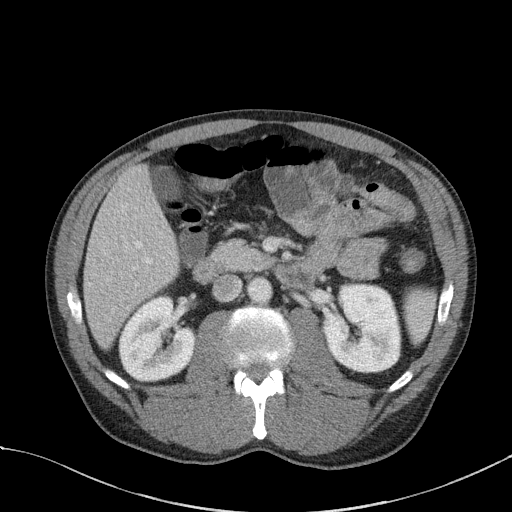
[im 61/95  bone]
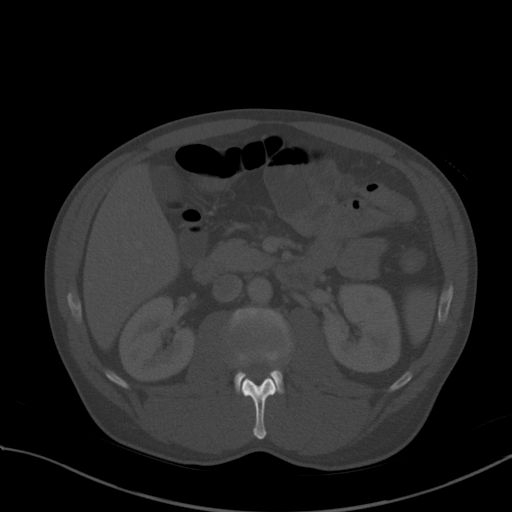
[im 67/95  soft-tissue]
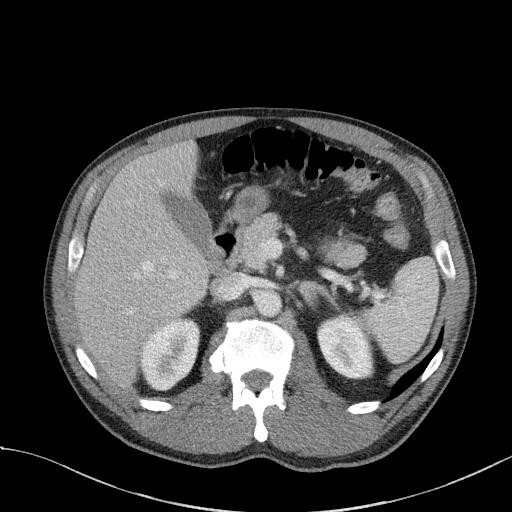
[im 72/95  soft-tissue]
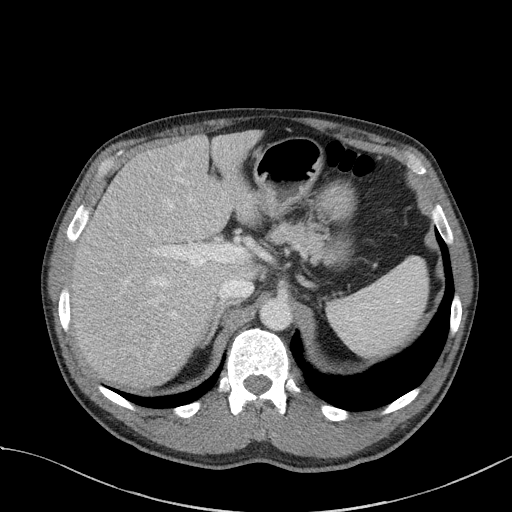
[im 83/95  soft-tissue]
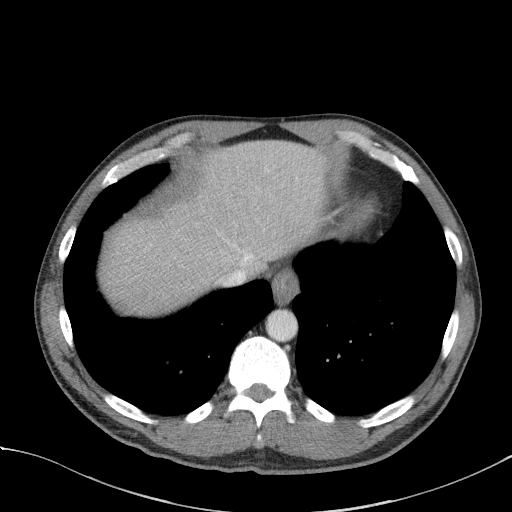
[im 89/95  soft-tissue]
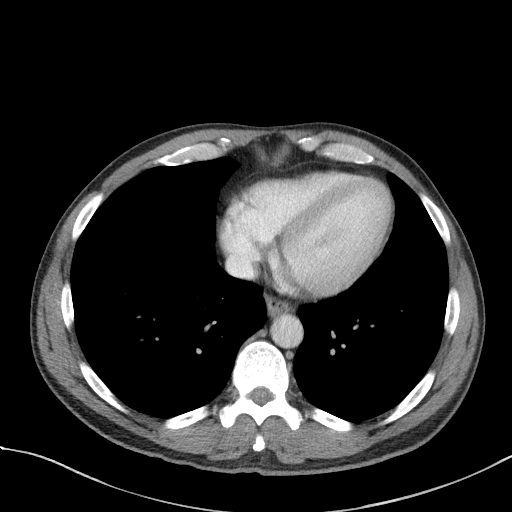

[Series 6: a/p w/ cor · coronal · 0.69mm/px · 3 of 132 slices shown]
[im 44/132  soft-tissue]
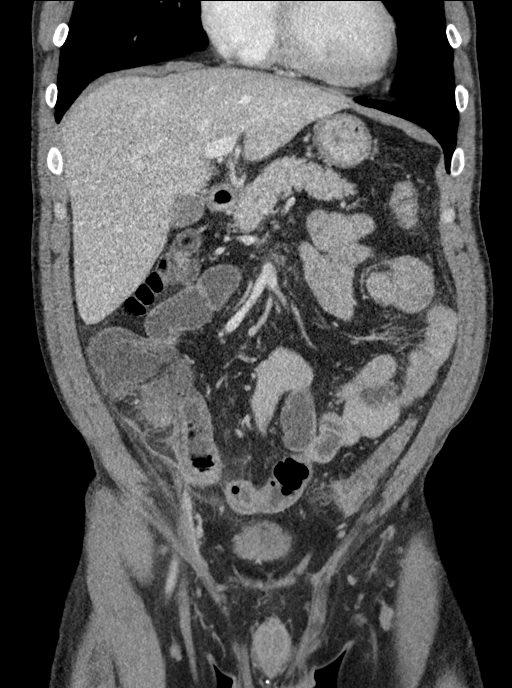
[im 59/132  soft-tissue]
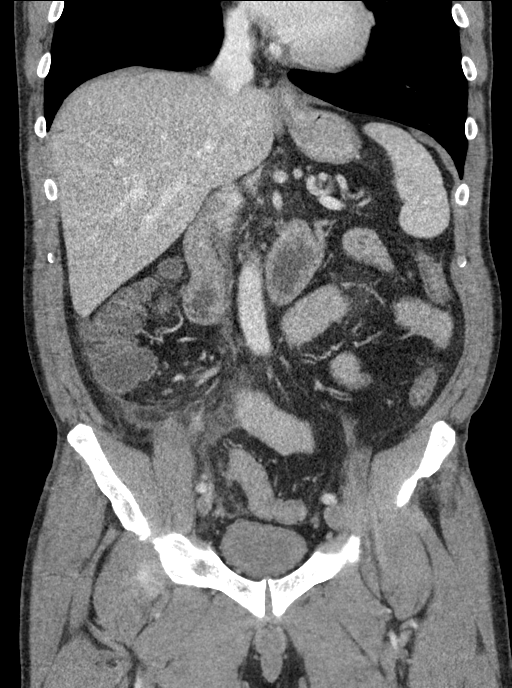
[im 73/132  soft-tissue]
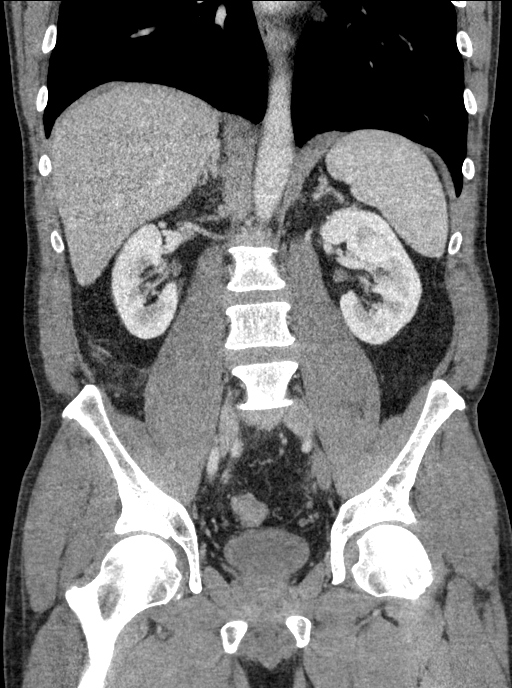

[16 of 46 positions shown; findings below may reference images not displayed]

FINDINGS: Lower chest: Lung bases clear

Hepatobiliary: Gallbladder and liver normal appearance

Pancreas: Normal appearance

Spleen: Normal appearance

Adrenals/Urinary Tract: Adrenal glands, kidneys, ureters, and
bladder normal appearance

Stomach/Bowel: Stomach and colon normal appearance. Mild thickening
of the terminal ileal wall, reactive. Changes of acute appendicitis
are present:

Appendix: Location: Caudal to cecal tip

Diameter: 10 mm

Appendicolith: None

Mucosal hyper-enhancement: Present

Extraluminal gas: Absent

Periappendiceal collection: Edema without discrete periappendiceal
fluid collection

Vascular/Lymphatic: Unremarkable

Reproductive: Minimal prostatic enlargement 4.4 x 3.3 cm image 82.
Seminal vesicles unremarkable.

Other: No free air or free fluid.  No hernia.

Musculoskeletal: Degenerative disc disease changes at lower lumbar
spine. No acute osseous findings.
IMPRESSION: Acute appendicitis as above.

## 2022-10-16 DIAGNOSIS — Z0000 Encounter for general adult medical examination without abnormal findings: Secondary | ICD-10-CM | POA: Diagnosis not present

## 2022-10-16 DIAGNOSIS — R69 Illness, unspecified: Secondary | ICD-10-CM | POA: Diagnosis not present

## 2022-10-16 DIAGNOSIS — Z23 Encounter for immunization: Secondary | ICD-10-CM | POA: Diagnosis not present

## 2022-10-16 DIAGNOSIS — R221 Localized swelling, mass and lump, neck: Secondary | ICD-10-CM | POA: Diagnosis not present

## 2022-10-16 DIAGNOSIS — I10 Essential (primary) hypertension: Secondary | ICD-10-CM | POA: Diagnosis not present

## 2022-10-16 DIAGNOSIS — R002 Palpitations: Secondary | ICD-10-CM | POA: Diagnosis not present

## 2022-10-18 ENCOUNTER — Other Ambulatory Visit: Payer: Self-pay | Admitting: Family Medicine

## 2022-10-18 DIAGNOSIS — R221 Localized swelling, mass and lump, neck: Secondary | ICD-10-CM

## 2022-10-23 DIAGNOSIS — Z1159 Encounter for screening for other viral diseases: Secondary | ICD-10-CM | POA: Diagnosis not present

## 2022-10-23 DIAGNOSIS — Z131 Encounter for screening for diabetes mellitus: Secondary | ICD-10-CM | POA: Diagnosis not present

## 2022-10-23 DIAGNOSIS — Z13220 Encounter for screening for lipoid disorders: Secondary | ICD-10-CM | POA: Diagnosis not present

## 2022-10-23 DIAGNOSIS — Z0000 Encounter for general adult medical examination without abnormal findings: Secondary | ICD-10-CM | POA: Diagnosis not present

## 2022-11-01 DIAGNOSIS — K529 Noninfective gastroenteritis and colitis, unspecified: Secondary | ICD-10-CM | POA: Diagnosis not present

## 2022-11-01 DIAGNOSIS — Z1211 Encounter for screening for malignant neoplasm of colon: Secondary | ICD-10-CM | POA: Diagnosis not present

## 2022-11-19 DIAGNOSIS — R69 Illness, unspecified: Secondary | ICD-10-CM | POA: Diagnosis not present

## 2022-11-19 DIAGNOSIS — F17210 Nicotine dependence, cigarettes, uncomplicated: Secondary | ICD-10-CM | POA: Diagnosis not present

## 2022-11-19 DIAGNOSIS — I10 Essential (primary) hypertension: Secondary | ICD-10-CM | POA: Diagnosis not present

## 2022-11-19 DIAGNOSIS — Z5181 Encounter for therapeutic drug level monitoring: Secondary | ICD-10-CM | POA: Diagnosis not present

## 2023-08-05 LAB — COLOGUARD: COLOGUARD: POSITIVE — AB

## 2024-01-24 ENCOUNTER — Emergency Department (HOSPITAL_COMMUNITY): Payer: 59

## 2024-01-24 ENCOUNTER — Inpatient Hospital Stay (HOSPITAL_COMMUNITY): Payer: 59

## 2024-01-24 ENCOUNTER — Other Ambulatory Visit: Payer: Self-pay

## 2024-01-24 ENCOUNTER — Encounter (HOSPITAL_COMMUNITY): Payer: Self-pay | Admitting: Emergency Medicine

## 2024-01-24 ENCOUNTER — Inpatient Hospital Stay (HOSPITAL_COMMUNITY)
Admission: EM | Admit: 2024-01-24 | Discharge: 2024-02-06 | DRG: 871 | Disposition: A | Discharge status: Home / Self Care | Payer: 59 | Attending: Student | Admitting: Student

## 2024-01-24 DIAGNOSIS — R0902 Hypoxemia: Principal | ICD-10-CM

## 2024-01-24 DIAGNOSIS — Z716 Tobacco abuse counseling: Secondary | ICD-10-CM

## 2024-01-24 DIAGNOSIS — J1000 Influenza due to other identified influenza virus with unspecified type of pneumonia: Principal | ICD-10-CM | POA: Diagnosis present

## 2024-01-24 DIAGNOSIS — N179 Acute kidney failure, unspecified: Secondary | ICD-10-CM | POA: Insufficient documentation

## 2024-01-24 DIAGNOSIS — D6959 Other secondary thrombocytopenia: Secondary | ICD-10-CM | POA: Diagnosis present

## 2024-01-24 DIAGNOSIS — A419 Sepsis, unspecified organism: Principal | ICD-10-CM | POA: Diagnosis present

## 2024-01-24 DIAGNOSIS — M79605 Pain in left leg: Secondary | ICD-10-CM | POA: Insufficient documentation

## 2024-01-24 DIAGNOSIS — Z79899 Other long term (current) drug therapy: Secondary | ICD-10-CM

## 2024-01-24 DIAGNOSIS — J1100 Influenza due to unidentified influenza virus with unspecified type of pneumonia: Secondary | ICD-10-CM

## 2024-01-24 DIAGNOSIS — J45909 Unspecified asthma, uncomplicated: Secondary | ICD-10-CM | POA: Diagnosis present

## 2024-01-24 DIAGNOSIS — F10239 Alcohol dependence with withdrawal, unspecified: Secondary | ICD-10-CM | POA: Diagnosis present

## 2024-01-24 DIAGNOSIS — D649 Anemia, unspecified: Secondary | ICD-10-CM | POA: Diagnosis present

## 2024-01-24 DIAGNOSIS — E876 Hypokalemia: Secondary | ICD-10-CM | POA: Insufficient documentation

## 2024-01-24 DIAGNOSIS — F17290 Nicotine dependence, other tobacco product, uncomplicated: Secondary | ICD-10-CM | POA: Diagnosis present

## 2024-01-24 DIAGNOSIS — E871 Hypo-osmolality and hyponatremia: Secondary | ICD-10-CM | POA: Diagnosis present

## 2024-01-24 DIAGNOSIS — F10930 Alcohol use, unspecified with withdrawal, uncomplicated: Secondary | ICD-10-CM | POA: Diagnosis not present

## 2024-01-24 DIAGNOSIS — I480 Paroxysmal atrial fibrillation: Secondary | ICD-10-CM | POA: Diagnosis present

## 2024-01-24 DIAGNOSIS — M5430 Sciatica, unspecified side: Secondary | ICD-10-CM | POA: Diagnosis present

## 2024-01-24 DIAGNOSIS — Z7951 Long term (current) use of inhaled steroids: Secondary | ICD-10-CM | POA: Diagnosis not present

## 2024-01-24 DIAGNOSIS — F1090 Alcohol use, unspecified, uncomplicated: Secondary | ICD-10-CM | POA: Diagnosis not present

## 2024-01-24 DIAGNOSIS — Z8673 Personal history of transient ischemic attack (TIA), and cerebral infarction without residual deficits: Secondary | ICD-10-CM

## 2024-01-24 DIAGNOSIS — F10939 Alcohol use, unspecified with withdrawal, unspecified: Secondary | ICD-10-CM | POA: Diagnosis not present

## 2024-01-24 DIAGNOSIS — R197 Diarrhea, unspecified: Secondary | ICD-10-CM | POA: Diagnosis present

## 2024-01-24 DIAGNOSIS — I4892 Unspecified atrial flutter: Secondary | ICD-10-CM | POA: Diagnosis present

## 2024-01-24 DIAGNOSIS — B354 Tinea corporis: Secondary | ICD-10-CM | POA: Diagnosis present

## 2024-01-24 DIAGNOSIS — Z95810 Presence of automatic (implantable) cardiac defibrillator: Secondary | ICD-10-CM | POA: Diagnosis not present

## 2024-01-24 DIAGNOSIS — I2489 Other forms of acute ischemic heart disease: Secondary | ICD-10-CM | POA: Diagnosis present

## 2024-01-24 DIAGNOSIS — J101 Influenza due to other identified influenza virus with other respiratory manifestations: Secondary | ICD-10-CM | POA: Diagnosis not present

## 2024-01-24 DIAGNOSIS — F17210 Nicotine dependence, cigarettes, uncomplicated: Secondary | ICD-10-CM | POA: Diagnosis present

## 2024-01-24 DIAGNOSIS — J189 Pneumonia, unspecified organism: Secondary | ICD-10-CM

## 2024-01-24 DIAGNOSIS — D72819 Decreased white blood cell count, unspecified: Secondary | ICD-10-CM | POA: Diagnosis present

## 2024-01-24 DIAGNOSIS — J09X1 Influenza due to identified novel influenza A virus with pneumonia: Secondary | ICD-10-CM | POA: Diagnosis not present

## 2024-01-24 DIAGNOSIS — E119 Type 2 diabetes mellitus without complications: Secondary | ICD-10-CM | POA: Diagnosis present

## 2024-01-24 DIAGNOSIS — R7989 Other specified abnormal findings of blood chemistry: Secondary | ICD-10-CM | POA: Insufficient documentation

## 2024-01-24 DIAGNOSIS — J9601 Acute respiratory failure with hypoxia: Secondary | ICD-10-CM

## 2024-01-24 DIAGNOSIS — M7989 Other specified soft tissue disorders: Secondary | ICD-10-CM | POA: Diagnosis not present

## 2024-01-24 DIAGNOSIS — R7401 Elevation of levels of liver transaminase levels: Secondary | ICD-10-CM | POA: Diagnosis present

## 2024-01-24 DIAGNOSIS — F1010 Alcohol abuse, uncomplicated: Secondary | ICD-10-CM

## 2024-01-24 DIAGNOSIS — L539 Erythematous condition, unspecified: Secondary | ICD-10-CM | POA: Diagnosis present

## 2024-01-24 DIAGNOSIS — E860 Dehydration: Secondary | ICD-10-CM | POA: Diagnosis present

## 2024-01-24 DIAGNOSIS — I2510 Atherosclerotic heart disease of native coronary artery without angina pectoris: Secondary | ICD-10-CM | POA: Diagnosis present

## 2024-01-24 DIAGNOSIS — J111 Influenza due to unidentified influenza virus with other respiratory manifestations: Secondary | ICD-10-CM

## 2024-01-24 DIAGNOSIS — Z720 Tobacco use: Secondary | ICD-10-CM

## 2024-01-24 DIAGNOSIS — I10 Essential (primary) hypertension: Secondary | ICD-10-CM | POA: Diagnosis present

## 2024-01-24 DIAGNOSIS — G4733 Obstructive sleep apnea (adult) (pediatric): Secondary | ICD-10-CM | POA: Diagnosis present

## 2024-01-24 DIAGNOSIS — I959 Hypotension, unspecified: Secondary | ICD-10-CM | POA: Diagnosis present

## 2024-01-24 DIAGNOSIS — R21 Rash and other nonspecific skin eruption: Secondary | ICD-10-CM | POA: Diagnosis present

## 2024-01-24 DIAGNOSIS — I4891 Unspecified atrial fibrillation: Secondary | ICD-10-CM

## 2024-01-24 LAB — CBC WITH DIFFERENTIAL/PLATELET
Abs Immature Granulocytes: 0.02 10*3/uL (ref 0.00–0.07)
Basophils Absolute: 0 10*3/uL (ref 0.0–0.1)
Basophils Relative: 0 %
Eosinophils Absolute: 0 10*3/uL (ref 0.0–0.5)
Eosinophils Relative: 0 %
HCT: 44.5 % (ref 39.0–52.0)
Hemoglobin: 16.2 g/dL (ref 13.0–17.0)
Immature Granulocytes: 1 %
Lymphocytes Relative: 11 %
Lymphs Abs: 0.4 10*3/uL — ABNORMAL LOW (ref 0.7–4.0)
MCH: 35.3 pg — ABNORMAL HIGH (ref 26.0–34.0)
MCHC: 36.4 g/dL — ABNORMAL HIGH (ref 30.0–36.0)
MCV: 96.9 fL (ref 80.0–100.0)
Monocytes Absolute: 0.1 10*3/uL (ref 0.1–1.0)
Monocytes Relative: 3 %
Neutro Abs: 2.9 10*3/uL (ref 1.7–7.7)
Neutrophils Relative %: 85 %
Platelets: 124 10*3/uL — ABNORMAL LOW (ref 150–400)
RBC: 4.59 MIL/uL (ref 4.22–5.81)
RDW: 12 % (ref 11.5–15.5)
Smear Review: DECREASED
WBC: 3.4 10*3/uL — ABNORMAL LOW (ref 4.0–10.5)
nRBC: 0 % (ref 0.0–0.2)

## 2024-01-24 LAB — COMPREHENSIVE METABOLIC PANEL
ALT: 42 U/L (ref 0–44)
AST: 124 U/L — ABNORMAL HIGH (ref 15–41)
Albumin: 3.1 g/dL — ABNORMAL LOW (ref 3.5–5.0)
Alkaline Phosphatase: 70 U/L (ref 38–126)
Anion gap: 16 — ABNORMAL HIGH (ref 5–15)
BUN: 21 mg/dL — ABNORMAL HIGH (ref 6–20)
CO2: 19 mmol/L — ABNORMAL LOW (ref 22–32)
Calcium: 8.1 mg/dL — ABNORMAL LOW (ref 8.9–10.3)
Chloride: 96 mmol/L — ABNORMAL LOW (ref 98–111)
Creatinine, Ser: 1.12 mg/dL (ref 0.61–1.24)
GFR, Estimated: >60 mL/min (ref >60)
Glucose, Bld: 111 mg/dL — ABNORMAL HIGH (ref 70–99)
Potassium: 2.9 mmol/L — ABNORMAL LOW (ref 3.5–5.1)
Sodium: 131 mmol/L — ABNORMAL LOW (ref 135–145)
Total Bilirubin: 0.8 mg/dL (ref 0.0–1.2)
Total Protein: 6.6 g/dL (ref 6.5–8.1)

## 2024-01-24 LAB — BRAIN NATRIURETIC PEPTIDE: B Natriuretic Peptide: 88.4 pg/mL (ref 0.0–100.0)

## 2024-01-24 LAB — RESP PANEL BY RT-PCR (RSV, FLU A&B, COVID)  RVPGX2
Influenza A by PCR: POSITIVE — AB
Influenza B by PCR: NEGATIVE
Resp Syncytial Virus by PCR: NEGATIVE
SARS Coronavirus 2 by RT PCR: NEGATIVE

## 2024-01-24 LAB — BASIC METABOLIC PANEL
Anion gap: 14 (ref 5–15)
BUN: 17 mg/dL (ref 6–20)
CO2: 18 mmol/L — ABNORMAL LOW (ref 22–32)
Calcium: 7.8 mg/dL — ABNORMAL LOW (ref 8.9–10.3)
Chloride: 101 mmol/L (ref 98–111)
Creatinine, Ser: 0.97 mg/dL (ref 0.61–1.24)
GFR, Estimated: >60 mL/min (ref >60)
Glucose, Bld: 106 mg/dL — ABNORMAL HIGH (ref 70–99)
Potassium: 3.2 mmol/L — ABNORMAL LOW (ref 3.5–5.1)
Sodium: 133 mmol/L — ABNORMAL LOW (ref 135–145)

## 2024-01-24 LAB — TSH: TSH: 0.62 u[IU]/mL (ref 0.350–4.500)

## 2024-01-24 LAB — TROPONIN I (HIGH SENSITIVITY)
Troponin I (High Sensitivity): 38 ng/L — ABNORMAL HIGH (ref <18)
Troponin I (High Sensitivity): 42 ng/L — ABNORMAL HIGH (ref <18)

## 2024-01-24 LAB — MAGNESIUM: Magnesium: 1.7 mg/dL (ref 1.7–2.4)

## 2024-01-24 LAB — ETHANOL: Alcohol, Ethyl (B): <10 mg/dL (ref <10)

## 2024-01-24 MED ORDER — POTASSIUM CHLORIDE 20 MEQ PO PACK
40.0000 meq | PACK | ORAL | Status: AC
  Administered 2024-01-24: 40 meq via ORAL

## 2024-01-24 MED ORDER — ACETAMINOPHEN 650 MG RE SUPP: 650.0000 mg | Freq: Four times a day (QID) | RECTAL | Status: DC | PRN

## 2024-01-24 MED ORDER — LORAZEPAM 2 MG/ML IJ SOLN
1.0000 mg | INTRAMUSCULAR | Status: DC | PRN
  Administered 2024-01-24: 2 mg via INTRAVENOUS
  Filled 2024-01-24: qty 1

## 2024-01-24 MED ORDER — METOPROLOL TARTRATE 5 MG/5ML IV SOLN: 5.0000 mg | INTRAVENOUS | Status: DC | PRN

## 2024-01-24 MED ORDER — DOCUSATE SODIUM 100 MG PO CAPS
100.0000 mg | ORAL_CAPSULE | Freq: Two times a day (BID) | ORAL | Status: DC
  Filled 2024-01-24: qty 1

## 2024-01-24 MED ORDER — ONDANSETRON HCL 4 MG/2ML IJ SOLN: 4.0000 mg | Freq: Four times a day (QID) | INTRAMUSCULAR | Status: DC | PRN

## 2024-01-24 MED ORDER — SODIUM CHLORIDE 0.9 % IV BOLUS
1000.0000 mL | Freq: Once | INTRAVENOUS | Status: AC
  Administered 2024-01-24: 1000 mL via INTRAVENOUS

## 2024-01-24 MED ORDER — SODIUM CHLORIDE 0.9% FLUSH
3.0000 mL | INTRAVENOUS | Status: DC | PRN
  Administered 2024-02-04: 3 mL via INTRAVENOUS

## 2024-01-24 MED ORDER — LORAZEPAM 1 MG PO TABS: 1.0000 mg | ORAL_TABLET | ORAL | Status: DC | PRN

## 2024-01-24 MED ORDER — SODIUM CHLORIDE 0.9 % IV SOLN
100.0000 mg | Freq: Two times a day (BID) | INTRAVENOUS | Status: DC
  Administered 2024-01-25 – 2024-01-26 (×3): 100 mg via INTRAVENOUS
  Filled 2024-01-24 (×4): qty 100

## 2024-01-24 MED ORDER — HYDROXYZINE HCL 10 MG PO TABS
10.0000 mg | ORAL_TABLET | Freq: Four times a day (QID) | ORAL | Status: DC | PRN
Start: 2024-01-24 — End: 2024-02-06
  Administered 2024-01-26 (×2): 10 mg via ORAL
  Filled 2024-01-24 (×3): qty 1

## 2024-01-24 MED ORDER — ASPIRIN 81 MG PO CHEW
324.0000 mg | CHEWABLE_TABLET | Freq: Once | ORAL | Status: AC
  Administered 2024-01-24: 324 mg via ORAL
  Filled 2024-01-24: qty 4

## 2024-01-24 MED ORDER — SODIUM CHLORIDE 0.9 % IV SOLN
500.0000 mg | Freq: Once | INTRAVENOUS | Status: AC
  Administered 2024-01-24: 500 mg via INTRAVENOUS
  Filled 2024-01-24: qty 5

## 2024-01-24 MED ORDER — ADULT MULTIVITAMIN W/MINERALS CH
1.0000 | ORAL_TABLET | Freq: Every day | ORAL | Status: DC
  Administered 2024-01-24 – 2024-02-06 (×14): 1 via ORAL
  Filled 2024-01-24 (×14): qty 1

## 2024-01-24 MED ORDER — FOLIC ACID 1 MG PO TABS
1.0000 mg | ORAL_TABLET | Freq: Every day | ORAL | Status: DC
  Administered 2024-01-24 – 2024-02-06 (×14): 1 mg via ORAL
  Filled 2024-01-24 (×14): qty 1

## 2024-01-24 MED ORDER — THIAMINE MONONITRATE 100 MG PO TABS
100.0000 mg | ORAL_TABLET | Freq: Every day | ORAL | Status: DC
  Administered 2024-01-25 – 2024-02-06 (×13): 100 mg via ORAL
  Filled 2024-01-24 (×13): qty 1

## 2024-01-24 MED ORDER — IOHEXOL 350 MG/ML SOLN
75.0000 mL | Freq: Once | INTRAVENOUS | Status: AC | PRN
  Administered 2024-01-24: 75 mL via INTRAVENOUS

## 2024-01-24 MED ORDER — THIAMINE HCL 100 MG/ML IJ SOLN
100.0000 mg | Freq: Every day | INTRAMUSCULAR | Status: DC
  Administered 2024-01-24: 100 mg via INTRAVENOUS
  Filled 2024-01-24: qty 2

## 2024-01-24 MED ORDER — SODIUM CHLORIDE 0.9 % IV SOLN: INTRAVENOUS | Status: DC

## 2024-01-24 MED ORDER — SODIUM CHLORIDE 0.9 % IV SOLN: 250.0000 mL | INTRAVENOUS | Status: AC | PRN

## 2024-01-24 MED ORDER — ACETAMINOPHEN 325 MG PO TABS
650.0000 mg | ORAL_TABLET | Freq: Four times a day (QID) | ORAL | Status: DC | PRN
  Administered 2024-01-25 – 2024-02-04 (×5): 650 mg via ORAL
  Filled 2024-01-24 (×6): qty 2

## 2024-01-24 MED ORDER — GUAIFENESIN ER 600 MG PO TB12
600.0000 mg | ORAL_TABLET | Freq: Two times a day (BID) | ORAL | Status: DC
Start: 2024-01-24 — End: 2024-01-27
  Administered 2024-01-24 – 2024-01-27 (×6): 600 mg via ORAL
  Filled 2024-01-24 (×6): qty 1

## 2024-01-24 MED ORDER — SODIUM CHLORIDE 0.9 % IV SOLN
1.0000 g | Freq: Once | INTRAVENOUS | Status: AC
  Administered 2024-01-24: 1 g via INTRAVENOUS
  Filled 2024-01-24: qty 10

## 2024-01-24 MED ORDER — POTASSIUM CHLORIDE CRYS ER 20 MEQ PO TBCR
40.0000 meq | EXTENDED_RELEASE_TABLET | Freq: Once | ORAL | Status: AC
  Administered 2024-01-24: 40 meq via ORAL
  Filled 2024-01-24: qty 2

## 2024-01-24 MED ORDER — ONDANSETRON HCL 4 MG PO TABS: 4.0000 mg | ORAL_TABLET | Freq: Four times a day (QID) | ORAL | Status: DC | PRN

## 2024-01-24 MED ORDER — ENOXAPARIN SODIUM 40 MG/0.4ML IJ SOSY
40.0000 mg | PREFILLED_SYRINGE | INTRAMUSCULAR | Status: DC
  Administered 2024-01-25 – 2024-02-06 (×13): 40 mg via SUBCUTANEOUS
  Filled 2024-01-24 (×13): qty 0.4

## 2024-01-24 MED ORDER — LEVALBUTEROL HCL 0.63 MG/3ML IN NEBU: 0.6300 mg | INHALATION_SOLUTION | Freq: Four times a day (QID) | RESPIRATORY_TRACT | Status: DC | PRN

## 2024-01-24 MED ORDER — SODIUM CHLORIDE 0.9 % IV SOLN
2.0000 g | INTRAVENOUS | Status: DC
  Administered 2024-01-25 – 2024-01-27 (×3): 2 g via INTRAVENOUS
  Filled 2024-01-24 (×3): qty 20

## 2024-01-24 MED ORDER — SODIUM CHLORIDE 0.9 % IV SOLN: 100.0000 mg | Freq: Two times a day (BID) | INTRAVENOUS | Status: DC

## 2024-01-24 MED ORDER — SODIUM CHLORIDE 0.9% FLUSH
3.0000 mL | Freq: Two times a day (BID) | INTRAVENOUS | Status: DC
  Administered 2024-01-24 – 2024-02-06 (×22): 3 mL via INTRAVENOUS

## 2024-01-24 MED ORDER — OSELTAMIVIR PHOSPHATE 75 MG PO CAPS
75.0000 mg | ORAL_CAPSULE | Freq: Two times a day (BID) | ORAL | Status: DC
  Administered 2024-01-24 – 2024-01-28 (×8): 75 mg via ORAL
  Filled 2024-01-24 (×10): qty 1

## 2024-01-24 MED ORDER — POTASSIUM CHLORIDE 10 MEQ/100ML IV SOLN
10.0000 meq | Freq: Once | INTRAVENOUS | Status: AC
  Administered 2024-01-24: 10 meq via INTRAVENOUS
  Filled 2024-01-24: qty 100

## 2024-01-24 MED ORDER — IPRATROPIUM-ALBUTEROL 0.5-2.5 (3) MG/3ML IN SOLN
3.0000 mL | Freq: Once | RESPIRATORY_TRACT | Status: AC
  Administered 2024-01-24: 3 mL via RESPIRATORY_TRACT
  Filled 2024-01-24: qty 3

## 2024-01-24 NOTE — ED Provider Notes (Signed)
 3:56 PM Patient signed out to me by previous ED physician. Pt is a 58 yo male presenting for myalgias, nausea, vomiting, decreased po, cough.  Hypoxic on arrival in the 80s. Currently on 5 L [place].   Afib with RVR on arrival at 103bpm. Rate improved with fluids for dehydration but still in afib.   Labs pending.   Hx of alcoholism. Says no alcohol for a week.SABRA CIWA started.  Plan: admit after labs  Physical Exam  BP 131/85   Pulse 84   Temp 98.4 F (36.9 C)   Resp (!) 28   SpO2 92%   Physical Exam  Procedures  Procedures  ED Course / MDM   Clinical Course as of 01/24/24 1753  Fri Jan 24, 2024  1609 Influenza A By PCR(!): POSITIVE [SG]    Clinical Course User Index [SG] Elnor Jayson LABOR, DO   Medical Decision Making Amount and/or Complexity of Data Reviewed Labs: ordered. Decision-making details documented in ED Course. Radiology: ordered.  Risk OTC drugs. Prescription drug management. Decision regarding hospitalization.   Influenza PCR positive.  Likely attributing to nausea, vomiting, diarrhea, myalgias, cough, shortness of breath, and hypoxia.  Patient currently satting at 91% on 5 L nasal cannula.  Chest x-ray concerning for focal left upper lobe opacity.  Azithromycin  and Rocephin  given prophylactically for concerns for secondary bacterial pneumonia.  Procalcitonin ordered and pending.  Will defer to primary team to discontinue antibiotics after procalcitonin results.  Natremia and hypokalemia present.  Patient received 1 L NaCl.  Repeat ordered.  Potassium oral and IV ordered.  These both are likely secondary to nausea, vomiting, diarrhea and dehydration but also may be secondary to patient's chronic alcohol abuse.  AST elevated.  CIWA protocol on.  Admission for acute respiratory failure with hypoxia secondary to influenza with hyponatremia hypokalemia and dehydration.  Magnesium  lab ordered and pending.  Patient accepted by admitting physician.    Elnor Hila P,  DO 01/24/24 NELIDA

## 2024-01-24 NOTE — ED Provider Notes (Signed)
 [facility] 4E CV SURGICAL PROGRESSIVE CARE Provider Note  CSN: [id number] Arrival date & time: 01/24/24 1343  Chief Complaint(s) Leg Pain and Shortness of Breath  HPI JAMY CLECKLER is a 58 y.o. male with past medical history as below, significant for etoh abuse, HTN, tobacco use, appendectomy who presents to the ED with complaint of, dyspnea, URI symptoms.   Patient received and feeling unwell for approximately 1 to 2 weeks, he is having a fever, productive cough with colored sputum, body aches.  Fever but thinks it was very high.  Chills.  He stopped drinking alcohol approximately 1 to 2 weeks ago, stopped smoking cigarettes 1 to 2 weeks ago.  Reports that he would drink between 4 and 6 beers daily.  Denies history of prior alcohol withdrawal.  Reports that he had history of palpitations but does not take medication for it, improved with caffeine and etoh reduction. Saw cardiology 11/24.  He was hypoxic in triage, cyanotic, improved with 5 L nasal cannula.  Does not use home oxygen.  primary care at [facility]  Past Medical History Past Medical History:  Diagnosis Date   Excessive drinking alcohol    Tinnitus    Tobacco use    Patient Active Problem List   Diagnosis Date Noted   Acute hypoxic respiratory failure (HCC) 01/24/2024   CAP (community acquired pneumonia) 01/24/2024   Influenza A H1N1 infection 01/24/2024   Hyponatremia 01/24/2024   Hypokalemia 01/24/2024   AKI (acute kidney injury) (HCC) 01/24/2024   Alcohol withdrawal (HCC) 01/24/2024   Paroxysmal atrial fibrillation (HCC) 01/24/2024   Elevated troponin 01/24/2024   Left leg pain 01/24/2024   Appendicitis 01/20/2018   Home Medication(s) Prior to Admission medications   Not on File                                                                                                                                    Past Surgical History Past Surgical History:  Procedure Laterality Date   FRACTURE SURGERY     LAPAROSCOPIC  APPENDECTOMY N/A 01/21/2018   Procedure: APPENDECTOMY LAPAROSCOPIC;  Surgeon: Vanderbilt Ned, MD;  Location: [facility] OR;  Service: General;  Laterality: N/A;   Family History History reviewed. No pertinent family history.  Social History Social History   Tobacco Use   Smoking status: Every Day    Types: Cigars   Smokeless tobacco: Never  Vaping Use   Vaping status: Never Used  Substance Use Topics   Alcohol use: Yes    Alcohol/week: 35.0 standard drinks of alcohol    Types: 35 Cans of beer per week   Drug use: No   Allergies Patient has no known allergies.  Review of Systems Review of Systems  Constitutional:  Positive for chills and fever.  HENT:  Positive for congestion.   Respiratory:  Positive for cough, chest tightness and shortness of breath.   Gastrointestinal:  Positive for diarrhea, nausea and vomiting.  Negative for abdominal pain.  Genitourinary:  Negative for dysuria.  Musculoskeletal:  Positive for arthralgias.  Neurological:  Positive for light-headedness. Negative for headaches.    Physical Exam Vital Signs  I have reviewed the triage vital signs BP 108/74 (BP Location: Left Arm)   Pulse 71   Temp 98.3 F (36.8 C) (Oral)   Resp (!) 21   SpO2 90%  Physical Exam Vitals and nursing note reviewed.  Constitutional:      General: He is in acute distress.     Appearance: He is well-developed. He is ill-appearing.  HENT:     Head: Normocephalic and atraumatic.     Right Ear: External ear normal.     Left Ear: External ear normal.     Mouth/Throat:     Mouth: Mucous membranes are moist.  Eyes:     General: No scleral icterus. Cardiovascular:     Rate and Rhythm: Tachycardia present. Rhythm irregular.     Pulses: Normal pulses.     Heart sounds: Normal heart sounds.  Pulmonary:     Effort: Pulmonary effort is normal. Tachypnea present. No respiratory distress.     Breath sounds: Decreased breath sounds and wheezing present.     Comments: Coarse  b/l Abdominal:     General: Abdomen is flat.     Palpations: Abdomen is soft.     Tenderness: There is no abdominal tenderness.  Musculoskeletal:     Cervical back: No rigidity.     Right lower leg: No edema.     Left lower leg: No edema.  Skin:    General: Skin is warm and dry.     Capillary Refill: Capillary refill takes less than 2 seconds.  Neurological:     Mental Status: He is alert.  Psychiatric:        Mood and Affect: Mood normal.        Behavior: Behavior normal.     ED Results and Treatments Labs (all labs ordered are listed, but only abnormal results are displayed) Labs Reviewed  RESP PANEL BY RT-PCR (RSV, FLU A&B, COVID)  RVPGX2 - Abnormal; Notable for the following components:      Result Value   Influenza A by PCR POSITIVE (*)    All other components within normal limits  COMPREHENSIVE METABOLIC PANEL - Abnormal; Notable for the following components:   Sodium 131 (*)    Potassium 2.9 (*)    Chloride 96 (*)    CO2 19 (*)    Glucose, Bld 111 (*)    BUN 21 (*)    Calcium  8.1 (*)    Albumin 3.1 (*)    AST 124 (*)    Anion gap 16 (*)    All other components within normal limits  CBC WITH DIFFERENTIAL/PLATELET - Abnormal; Notable for the following components:   WBC 3.4 (*)    MCH 35.3 (*)    MCHC 36.4 (*)    Platelets 124 (*)    Lymphs Abs 0.4 (*)    All other components within normal limits  COMPREHENSIVE METABOLIC PANEL - Abnormal; Notable for the following components:   Sodium 134 (*)    Potassium 3.0 (*)    CO2 18 (*)    Glucose, Bld 118 (*)    Calcium  7.7 (*)    Total Protein 5.4 (*)    Albumin 2.5 (*)    AST 126 (*)    All other components within normal limits  CBC - Abnormal; Notable for the  following components:   RBC 4.07 (*)    MCH 35.6 (*)    MCHC 36.6 (*)    Platelets 107 (*)    All other components within normal limits  BASIC METABOLIC PANEL - Abnormal; Notable for the following components:   Sodium 133 (*)    Potassium 3.2 (*)     CO2 18 (*)    Glucose, Bld 106 (*)    Calcium  7.8 (*)    All other components within normal limits  URINALYSIS, ROUTINE W REFLEX MICROSCOPIC - Abnormal; Notable for the following components:   Specific Gravity, Urine 1.032 (*)    Hgb urine dipstick MODERATE (*)    Protein, ur 100 (*)    All other components within normal limits  BLOOD GAS, VENOUS - Abnormal; Notable for the following components:   pH, Ven 7.44 (*)    pCO2, Ven 30 (*)    pO2, Ven 73 (*)    Acid-base deficit 2.7 (*)    All other components within normal limits  TROPONIN I (HIGH SENSITIVITY) - Abnormal; Notable for the following components:   Troponin I (High Sensitivity) 38 (*)    All other components within normal limits  TROPONIN I (HIGH SENSITIVITY) - Abnormal; Notable for the following components:   Troponin I (High Sensitivity) 42 (*)    All other components within normal limits  CULTURE, BLOOD (ROUTINE X 2)  CULTURE, BLOOD (ROUTINE X 2)  EXPECTORATED SPUTUM ASSESSMENT W GRAM STAIN, RFLX TO RESP C  GASTROINTESTINAL PANEL BY PCR, STOOL (REPLACES STOOL CULTURE)  C DIFFICILE QUICK SCREEN W PCR REFLEX    BRAIN NATRIURETIC PEPTIDE  MAGNESIUM   PROCALCITONIN  HIV ANTIBODY (ROUTINE TESTING W REFLEX)  STREP PNEUMONIAE URINARY ANTIGEN  TSH  ETHANOL  CREATININE, URINE, RANDOM  SODIUM, URINE, RANDOM  HEPATITIS PANEL, ACUTE  LEGIONELLA PNEUMOPHILA SEROGP 1 UR AG  CBC  COMPREHENSIVE METABOLIC PANEL  MAGNESIUM   PHOSPHORUS  C-REACTIVE PROTEIN                                                                                                                          Radiology   Pertinent labs & imaging results that were available during my care of the patient were reviewed by me and considered in my medical decision making (see MDM for details).  Medications Ordered in ED Medications  LORazepam  (ATIVAN ) tablet 1-4 mg ( Oral See Alternative 01/24/24 1607)    Or  LORazepam  (ATIVAN ) injection 1-4 mg (2 mg Intravenous  Given 01/24/24 1607)  thiamine  (VITAMIN B1) tablet 100 mg (100 mg Oral Given 01/25/24 1044)    Or  thiamine  (VITAMIN B1) injection 100 mg ( Intravenous See Alternative 01/25/24 1044)  folic acid  (FOLVITE ) tablet 1 mg (1 mg Oral Given 01/25/24 1043)  multivitamin with minerals tablet 1 tablet (1 tablet Oral Given 01/25/24 1043)  cefTRIAXone  (ROCEPHIN ) 2 g in sodium chloride  0.9 % 100 mL IVPB (0 g Intravenous Stopped 01/25/24 1056)  enoxaparin  (LOVENOX ) injection 40  mg (40 mg Subcutaneous Given 01/25/24 1044)  sodium chloride  flush (NS) 0.9 % injection 3 mL (3 mLs Intravenous Not Given 01/25/24 1117)  sodium chloride  flush (NS) 0.9 % injection 3 mL (has no administration in time range)  0.9 %  sodium chloride  infusion (has no administration in time range)  acetaminophen  (TYLENOL ) tablet 650 mg (650 mg Oral Given 01/25/24 0211)    Or  acetaminophen  (TYLENOL ) suppository 650 mg ( Rectal See Alternative 01/25/24 0211)  ondansetron  (ZOFRAN ) tablet 4 mg (has no administration in time range)    Or  ondansetron  (ZOFRAN ) injection 4 mg (has no administration in time range)  metoprolol  tartrate (LOPRESSOR ) injection 5 mg (has no administration in time range)  hydrOXYzine  (ATARAX ) tablet 10 mg (has no administration in time range)  doxycycline  (VIBRAMYCIN ) 100 mg in sodium chloride  0.9 % 250 mL IVPB (0 mg Intravenous Stopped 01/25/24 1353)  guaiFENesin  (MUCINEX ) 12 hr tablet 600 mg (600 mg Oral Given 01/25/24 1043)  oseltamivir  (TAMIFLU ) capsule 75 mg (75 mg Oral Given 01/25/24 1044)  levalbuterol  (XOPENEX ) nebulizer solution 0.63 mg (0.63 mg Nebulization Given 01/25/24 1530)  ipratropium (ATROVENT ) nebulizer solution 0.5 mg (0.5 mg Nebulization Given 01/25/24 1530)  ipratropium-albuterol  (DUONEB) 0.5-2.5 (3) MG/3ML nebulizer solution 3 mL (has no administration in time range)  0.9 % NaCl with KCl 40 mEq / L  infusion ( Intravenous Infusion Verify 01/25/24 1550)  potassium chloride  SA (KLOR-CON  M) CR tablet 40 mEq (40 mEq Oral Not  Given 01/25/24 1542)  gabapentin  (NEURONTIN ) capsule 300 mg (300 mg Oral Not Given 01/25/24 1549)  morphine  (PF) 2 MG/ML injection 2 mg (2 mg Intravenous Given 01/25/24 1041)  oxyCODONE  (Oxy IR/ROXICODONE ) immediate release tablet 5 mg (has no administration in time range)  lactobacillus (FLORANEX/LACTINEX) granules 1 g (1 g Oral Not Given 01/25/24 1622)  ipratropium-albuterol  (DUONEB) 0.5-2.5 (3) MG/3ML nebulizer solution 3 mL (3 mLs Nebulization Given 01/24/24 1445)  aspirin  chewable tablet 324 mg (324 mg Oral Given 01/24/24 1441)  sodium chloride  0.9 % bolus 1,000 mL (0 mLs Intravenous Stopped 01/24/24 1700)  potassium chloride  SA (KLOR-CON  M) CR tablet 40 mEq (40 mEq Oral Given 01/24/24 1854)  potassium chloride  10 mEq in 100 mL IVPB (0 mEq Intravenous Stopped 01/24/24 2004)  sodium chloride  0.9 % bolus 1,000 mL (0 mLs Intravenous Stopped 01/24/24 2126)  cefTRIAXone  (ROCEPHIN ) 1 g in sodium chloride  0.9 % 100 mL IVPB (0 g Intravenous Stopped 01/24/24 2126)  azithromycin  (ZITHROMAX ) 500 mg in sodium chloride  0.9 % 250 mL IVPB (500 mg Intravenous New Bag/Given 01/24/24 2127)  potassium chloride  (KLOR-CON ) packet 40 mEq (40 mEq Oral Given 01/24/24 2016)  iohexol  (OMNIPAQUE ) 350 MG/ML injection 75 mL (75 mLs Intravenous Contrast Given 01/24/24 1834)  magnesium  sulfate IVPB 2 g 50 mL (2 g Intravenous New Bag/Given 01/25/24 1032)  potassium chloride  SA (KLOR-CON  M) CR tablet 40 mEq (40 mEq Oral Given 01/25/24 1043)  Procedures .Critical Care  Performed by: Elnor Jayson LABOR, DO Authorized by: Elnor Jayson LABOR, DO   Critical care provider statement:    Critical care time (minutes):  45   Critical care time was exclusive of:  Separately billable procedures and treating other patients   Critical care was necessary to treat or prevent imminent or life-threatening deterioration of the following  conditions:  Cardiac failure and respiratory failure   Critical care was time spent personally by me on the following activities:  Development of treatment plan with patient or surrogate, discussions with consultants, evaluation of patient's response to treatment, examination of patient, ordering and review of laboratory studies, ordering and review of radiographic studies, ordering and performing treatments and interventions, pulse oximetry, re-evaluation of patient's condition, review of old charts and obtaining history from patient or surrogate   (including critical care time)  Medical Decision Making / ED Course    Medical Decision Making:    IDO WOLLMAN is a 58 y.o. malewith past medical history as below, significant for etoh abuse, HTN, tobacco use, appendectomy who presents to the ED with complaint of, dyspnea, URI symptoms. . The complaint involves an extensive differential diagnosis and also carries with it a high risk of complications and morbidity.  Serious etiology was considered. Ddx includes but is not limited to: In my evaluation of this patient's dyspnea my DDx includes, but is not limited to, pneumonia, pulmonary embolism, pneumothorax, pulmonary edema, metabolic acidosis, asthma, COPD, cardiac cause, anemia, anxiety, etc.    Complete initial physical exam performed, notably the patient was in mild respiratory stress in triage, cyanotic, pulse ox 83 to 84% on room air.  Improved to 93% on 4 L.  Tachycardia with irregular rhythm noted.    Reviewed and confirmed nursing documentation for past medical history, family history, social history.  Vital signs reviewed.    Clinical Course as of 01/25/24 1935  [place] Jan 24, 2024  1609 Influenza A By PCR(!): POSITIVE [SG]    Clinical Course User Index [SG] Elnor Jayson LABOR, DO    Brief summary: 58 year old male with history as above including tobacco abuse, daily alcohol abuse, hypertension here with complaint of dyspnea, URI  symptoms.  Feeling well for 1 to 2 weeks, subjective fever and chills.  Lightheaded, diarrhea, vomiting, cough, exertional dyspnea.  Cyanotic on arrival, pulse ox mid 80s.  No home oxygen use.  No history of COPD but does have extensive smoking history.  Improved with 4 L nasal cannula, increased to 5L to keep O2 >94%  A-fib with RVR noted on EKG, no formal diagnosis of this in the past.  Has a history of palpitations, saw cardiology last year. [facility] 1  Chronic etoh abuse, stopped drinking recently, CIWA ordered.   He is positive for the flu, hypoxic, A-fib with RVR.  He is pending CT, he will require admission.  And abdomen, EDP pending CT and admission.               Additional history obtained: -Additional history obtained from na -External records from outside source obtained and reviewed including: Chart review including previous notes, labs, imaging, consultation notes including  Primary care documentation, home medications   Lab Tests: -I ordered, reviewed, and interpreted labs.   The pertinent results include:   Labs Reviewed  RESP PANEL BY RT-PCR (RSV, FLU A&B, COVID)  RVPGX2 - Abnormal; Notable for the following components:      Result Value   Influenza A by PCR POSITIVE (*)  All other components within normal limits  COMPREHENSIVE METABOLIC PANEL - Abnormal; Notable for the following components:   Sodium 131 (*)    Potassium 2.9 (*)    Chloride 96 (*)    CO2 19 (*)    Glucose, Bld 111 (*)    BUN 21 (*)    Calcium  8.1 (*)    Albumin 3.1 (*)    AST 124 (*)    Anion gap 16 (*)    All other components within normal limits  CBC WITH DIFFERENTIAL/PLATELET - Abnormal; Notable for the following components:   WBC 3.4 (*)    MCH 35.3 (*)    MCHC 36.4 (*)    Platelets 124 (*)    Lymphs Abs 0.4 (*)    All other components within normal limits  COMPREHENSIVE METABOLIC PANEL - Abnormal; Notable for the following components:   Sodium 134 (*)    Potassium  3.0 (*)    CO2 18 (*)    Glucose, Bld 118 (*)    Calcium  7.7 (*)    Total Protein 5.4 (*)    Albumin 2.5 (*)    AST 126 (*)    All other components within normal limits  CBC - Abnormal; Notable for the following components:   RBC 4.07 (*)    MCH 35.6 (*)    MCHC 36.6 (*)    Platelets 107 (*)    All other components within normal limits  BASIC METABOLIC PANEL - Abnormal; Notable for the following components:   Sodium 133 (*)    Potassium 3.2 (*)    CO2 18 (*)    Glucose, Bld 106 (*)    Calcium  7.8 (*)    All other components within normal limits  URINALYSIS, ROUTINE W REFLEX MICROSCOPIC - Abnormal; Notable for the following components:   Specific Gravity, Urine 1.032 (*)    Hgb urine dipstick MODERATE (*)    Protein, ur 100 (*)    All other components within normal limits  BLOOD GAS, VENOUS - Abnormal; Notable for the following components:   pH, Ven 7.44 (*)    pCO2, Ven 30 (*)    pO2, Ven 73 (*)    Acid-base deficit 2.7 (*)    All other components within normal limits  TROPONIN I (HIGH SENSITIVITY) - Abnormal; Notable for the following components:   Troponin I (High Sensitivity) 38 (*)    All other components within normal limits  TROPONIN I (HIGH SENSITIVITY) - Abnormal; Notable for the following components:   Troponin I (High Sensitivity) 42 (*)    All other components within normal limits  CULTURE, BLOOD (ROUTINE X 2)  CULTURE, BLOOD (ROUTINE X 2)  EXPECTORATED SPUTUM ASSESSMENT W GRAM STAIN, RFLX TO RESP C  GASTROINTESTINAL PANEL BY PCR, STOOL (REPLACES STOOL CULTURE)  C DIFFICILE QUICK SCREEN W PCR REFLEX    BRAIN NATRIURETIC PEPTIDE  MAGNESIUM   PROCALCITONIN  HIV ANTIBODY (ROUTINE TESTING W REFLEX)  STREP PNEUMONIAE URINARY ANTIGEN  TSH  ETHANOL  CREATININE, URINE, RANDOM  SODIUM, URINE, RANDOM  HEPATITIS PANEL, ACUTE  LEGIONELLA PNEUMOPHILA SEROGP 1 UR AG  CBC  COMPREHENSIVE METABOLIC PANEL  MAGNESIUM   PHOSPHORUS  C-REACTIVE PROTEIN    Notable for  hypokalemia, positive for the flu, mild troponin elevation  EKG   EKG Interpretation Date/Time:  Friday January 24 2024 14:05:09 EST Ventricular Rate:  107 PR Interval:    QRS Duration:  90 QT Interval:  330 QTC Calculation: 440 R Axis:   84  Text  Interpretation: ST & T wave abnormality, consider inferolateral ischemia Abnormal ECG No previous ECGs available Irregular Interpretation limited secondary to artifact Confirmed by Elnor Savant ([id number]) on 01/24/2024 2:16:44 PM         Imaging Studies ordered: I ordered imaging studies including CT PE / CXR  >> IMAGING PENDING  Medicines ordered and prescription drug management: Meds ordered this encounter  Medications   ipratropium-albuterol  (DUONEB) 0.5-2.5 (3) MG/3ML nebulizer solution 3 mL   aspirin  chewable tablet 324 mg   sodium chloride  0.9 % bolus 1,000 mL   OR Linked Order Group    LORazepam  (ATIVAN ) tablet 1-4 mg     CIWA-AR < 5 =:   0 mg     CIWA-AR 5 -10 =:   1 mg     CIWA-AR 11 -15 =:   2 mg     CIWA-AR 16 -20 =:   3 mg     CIWA-AR 16 -20 =:   Recheck CIWA-AR in 1 hour; if > 20 notify MD     CIWA-AR > 20 =:   4 mg     CIWA-AR > 20 =:   Call Rapid Response    LORazepam  (ATIVAN ) injection 1-4 mg     CIWA-AR < 5 =:   0 mg     CIWA-AR 5 -10 =:   1 mg     CIWA-AR 11 -15 =:   2 mg     CIWA-AR 16 -20 =:   3 mg     CIWA-AR 16 -20 =:   Recheck CIWA-AR in 1 hour; if > 20 notify MD     CIWA-AR > 20 =:   4 mg     CIWA-AR > 20 =:   Call Rapid Response   OR Linked Order Group    thiamine  (VITAMIN B1) tablet 100 mg    thiamine  (VITAMIN B1) injection 100 mg   folic acid  (FOLVITE ) tablet 1 mg   multivitamin with minerals tablet 1 tablet   potassium chloride  SA (KLOR-CON  M) CR tablet 40 mEq   potassium chloride  10 mEq in 100 mL IVPB   sodium chloride  0.9 % bolus 1,000 mL   cefTRIAXone  (ROCEPHIN ) 1 g in sodium chloride  0.9 % 100 mL IVPB    Antibiotic Indication::   CAP   azithromycin  (ZITHROMAX ) 500 mg in sodium chloride  0.9 %  250 mL IVPB   potassium chloride  (KLOR-CON ) packet 40 mEq   cefTRIAXone  (ROCEPHIN ) 2 g in sodium chloride  0.9 % 100 mL IVPB    Antibiotic Indication::   CAP   DISCONTD: doxycycline  (VIBRAMYCIN ) 100 mg in sodium chloride  0.9 % 250 mL IVPB    Antibiotic Indication::   CAP   enoxaparin  (LOVENOX ) injection 40 mg   DISCONTD: 0.9 %  sodium chloride  infusion   sodium chloride  flush (NS) 0.9 % injection 3 mL   sodium chloride  flush (NS) 0.9 % injection 3 mL   0.9 %  sodium chloride  infusion   OR Linked Order Group    acetaminophen  (TYLENOL ) tablet 650 mg    acetaminophen  (TYLENOL ) suppository 650 mg   DISCONTD: docusate sodium  (COLACE) capsule 100 mg   OR Linked Order Group    ondansetron  (ZOFRAN ) tablet 4 mg    ondansetron  (ZOFRAN ) injection 4 mg   metoprolol  tartrate (LOPRESSOR ) injection 5 mg   hydrOXYzine  (ATARAX ) tablet 10 mg   doxycycline  (VIBRAMYCIN ) 100 mg in sodium chloride  0.9 % 250 mL IVPB    Antibiotic Indication::   CAP  DISCONTD: levalbuterol  (XOPENEX ) nebulizer solution 0.63 mg   guaiFENesin  (MUCINEX ) 12 hr tablet 600 mg   oseltamivir  (TAMIFLU ) capsule 75 mg   iohexol  (OMNIPAQUE ) 350 MG/ML injection 75 mL   levalbuterol  (XOPENEX ) nebulizer solution 0.63 mg   ipratropium (ATROVENT ) nebulizer solution 0.5 mg   ipratropium-albuterol  (DUONEB) 0.5-2.5 (3) MG/3ML nebulizer solution 3 mL   magnesium  sulfate IVPB 2 g 50 mL   0.9 % NaCl with KCl 40 mEq / L  infusion   potassium chloride  SA (KLOR-CON  M) CR tablet 40 mEq   potassium chloride  SA (KLOR-CON  M) CR tablet 40 mEq   gabapentin  (NEURONTIN ) capsule 300 mg   morphine  (PF) 2 MG/ML injection 2 mg   oxyCODONE  (Oxy IR/ROXICODONE ) immediate release tablet 5 mg    Refill:  0   lactobacillus (FLORANEX/LACTINEX) granules 1 g    -I have reviewed the patients home medicines and have made adjustments as needed   Consultations Obtained: na   Cardiac Monitoring: The patient was maintained on a cardiac monitor.  I personally  viewed and interpreted the cardiac monitored which showed an underlying rhythm of: afib Continuous pulse oximetry interpreted by myself, 97% on 5L.    Social Determinants of Health:  Diagnosis or treatment significantly limited by social determinants of health: current smoker   Reevaluation: After the interventions noted above, I reevaluated the patient and found that they have improved  Co morbidities that complicate the patient evaluation  Past Medical History:  Diagnosis Date   Excessive drinking alcohol    Tinnitus    Tobacco use       Dispostion: Disposition decision including need for hospitalization was considered, and patient disposition pending at time of sign out.    Final Clinical Impression(s) / ED Diagnoses Final diagnoses:  Hypoxia  Atrial fibrillation with RVR (HCC)  Alcohol abuse  Tobacco abuse  Hyponatremia  Hypokalemia  Influenza        Elnor Jayson LABOR, DO 01/25/24 1936

## 2024-01-24 NOTE — ED Notes (Signed)
 Daughter called for an update  Xavier [name]  [phone]

## 2024-01-24 NOTE — H&P (Signed)
 . History and Physical    Xavier [name] FMW:[id number] DOB: 19-Dec-1965 DOA: 01/24/2024  PCP: Bulah Alm RAMAN, PA-C   Patient coming from: Home   Chief Complaint:  Chief Complaint  Patient presents with   Leg Pain   Shortness of Breath   ED TRIAGE note:  Pt reports posterior left leg pain. Upon arrival to triage room pts RA sat 84%. Pt placed on O2 and now at 92%.      HPI:  Xavier [name] is a 58 y.o. male with chronic smoker and chronic alcohol use presented to emergency department with complaining of myalgia, fever, productive cough, shortness of breath and dyspnea. Patient reported he is not feeling well for 1 to 2 weeks 7 having fever, productive cough and body ache.  Patient also reported chills.  Reported drinking alcohol 1 to 2 weeks ago and stopped smoking 1 to 2 weeks ago as well however he reports he is continues to drink 4-6 beer on daily basis.  Denies any previous history of alcohol withdrawal related seizure.  Also patient continues to drink significant amount of caffeine as well per patient report. During my evaluation at the bedside patient reported that he has chest congestion, cough, fever, nausea and poor appetite for last few days.  Denies any chest pain, chest pressure and palpitation.   At presentation to ED patient found hypoxic O2 sat dropped to 80s percent currently on 5 L nasal cannula oxygen. In the ED patient has atrial fibrillation heart rate 105 210 which has been improved to 80 2:08 liter of fluid resuscitation.  Initial presentation patient found tachycardic heart rate 103, O2 sat 86% currently improved to 91% for liter, borderline hypotensive and tachypneic. EKG showing atrial fibrillation heart rate 107. Respiratory panel positive with influenza A. CMP showing low sodium 130, low potassium 2.9, low chloride 96, low bicarb 19, elevated creatinine 1.12, and transaminitis elevated AST 134, low albumin 3.1. CBC showing low WBC count 3.4 and low  platelet count 134.   stable H&H. Elevated troponin 38. Normal BNP.  Chest x-ray showingRight upper lobe airspace opacity is noted concerning for possible pneumonia. Nodular density is noted in left upper lobe concerning for possible neoplasm. CT scan of the chest is recommended for further evaluation.  Pending CTA chest without contrast.  In the ED patient has been given aspirin  324 mg.  Started on azithromycin  and ceftriaxone  for anemia.  Patient also started on Ativan  as needed for alcohol withdrawal.  Given oral potassium 40 mEq and IV 10.  Also received 2 L of NS bolus.  Hospital has been contacted for further management of acute hypoxic respiratory failure secondary to pneumonia and influenza infection, hypokalemia and hyponatremia, AKI, alcohol withdrawal, paroxysmal Atrial fibrillation, elevated troponin secondary to demand ischemia.  Initially patient has atrial fibrillation currently has been improved rate controlled to 80.Pending CTA chest to rule out pulmonary embolism.    Significant labs in the ED: Lab Orders         Resp panel by RT-PCR (RSV, Flu A&B, Covid) Anterior Nasal Swab         Culture, blood (routine x 2) Call MD if unable to obtain prior to antibiotics being given         Expectorated Sputum Assessment w Gram Stain, Rflx to Resp Cult         Comprehensive metabolic panel         CBC with Differential         Brain natriuretic  peptide         Magnesium          Procalcitonin         HIV Antibody (routine testing w rflx)         Legionella Pneumophila Serogp 1 Ur Ag         Strep pneumoniae urinary antigen         Comprehensive metabolic panel         CBC         TSH         Basic metabolic panel         Ethanol         Urinalysis, Routine w reflex microscopic -Urine, Clean Catch         Creatinine, urine, random         Sodium, urine, random         Hepatitis panel, acute       Review of Systems:  Review of Systems  Constitutional:  Positive for  malaise/fatigue. Negative for chills, fever and weight loss.  Respiratory:  Positive for cough, sputum production and shortness of breath. Negative for wheezing.   Cardiovascular:  Negative for chest pain, palpitations, orthopnea, leg swelling and PND.  Gastrointestinal:  Positive for nausea. Negative for abdominal pain, diarrhea, heartburn and vomiting.  Genitourinary:  Negative for dysuria.  Musculoskeletal:  Negative for back pain, joint pain, myalgias and neck pain.  Skin:  Positive for rash.  Neurological:  Negative for dizziness, tremors, seizures, loss of consciousness, weakness and headaches.  Psychiatric/Behavioral:  The patient has insomnia. The patient is not nervous/anxious.     Past Medical History:  Diagnosis Date   Excessive drinking alcohol    Tinnitus    Tobacco use     Past Surgical History:  Procedure Laterality Date   FRACTURE SURGERY     LAPAROSCOPIC APPENDECTOMY N/A 01/21/2018   Procedure: APPENDECTOMY LAPAROSCOPIC;  Surgeon: Vanderbilt Ned, MD;  Location: [facility] OR;  Service: General;  Laterality: N/A;     reports that he has been smoking cigars. He has never used smokeless tobacco. He reports current alcohol use of about 35.0 standard drinks of alcohol per week. He reports that he does not use drugs.  No Known Allergies  History reviewed. No pertinent family history.  Prior to Admission medications   Not on File     Physical Exam: Vitals:   01/24/24 1530 01/24/24 1600 01/24/24 2132 01/24/24 2134  BP: 120/79 (!) 108/53  115/71  Pulse: 88 84 (!) 54 84  Resp: (!) 24  (!) 24 (!) 30  Temp:    98.6 F (37 C)  TempSrc:    Oral  SpO2: 91%  91% 92%    Physical Exam Vitals and nursing note reviewed.  Constitutional:      Appearance: He is ill-appearing.  HENT:     Mouth/Throat:     Mouth: Mucous membranes are moist.  Cardiovascular:     Rate and Rhythm: Normal rate and regular rhythm.  Pulmonary:     Effort: Pulmonary effort is normal.     Breath  sounds: No decreased breath sounds, wheezing, rhonchi or rales.  Musculoskeletal:     Cervical back: Normal range of motion and neck supple.  Skin:    General: Skin is dry.     Capillary Refill: Capillary refill takes less than 2 seconds.  Neurological:     Mental Status: He is alert and oriented to person, place, and time.  Psychiatric:  Mood and Affect: Mood normal.      Labs on Admission: I have personally reviewed following labs and imaging studies  CBC: Recent Labs  Lab 01/24/24 1539  WBC 3.4*  NEUTROABS 2.9  HGB 16.2  HCT 44.5  MCV 96.9  PLT 124*   Basic Metabolic Panel: Recent Labs  Lab 01/24/24 1539  NA 131*  K 2.9*  CL 96*  CO2 19*  GLUCOSE 111*  BUN 21*  CREATININE 1.12  CALCIUM  8.1*   GFR: CrCl cannot be calculated (Unknown ideal weight.). Liver Function Tests: Recent Labs  Lab 01/24/24 1539  AST 124*  ALT 42  ALKPHOS 70  BILITOT 0.8  PROT 6.6  ALBUMIN 3.1*   No results for input(s): LIPASE, AMYLASE in the last 168 hours. No results for input(s): AMMONIA in the last 168 hours. Coagulation Profile: No results for input(s): INR, PROTIME in the last 168 hours. Cardiac Enzymes: Recent Labs  Lab 01/24/24 1539  TROPONINIHS 38*   BNP (last 3 results) Recent Labs    01/24/24 1539  BNP 88.4   HbA1C: No results for input(s): HGBA1C in the last 72 hours. CBG: No results for input(s): GLUCAP in the last 168 hours. Lipid Profile: No results for input(s): CHOL, HDL, LDLCALC, TRIG, CHOLHDL, LDLDIRECT in the last 72 hours. Thyroid Function Tests: No results for input(s): TSH, T4TOTAL, FREET4, T3FREE, THYROIDAB in the last 72 hours. Anemia Panel: No results for input(s): VITAMINB12, FOLATE, FERRITIN, TIBC, IRON, RETICCTPCT in the last 72 hours. Urine analysis:    Component Value Date/Time   COLORURINE AMBER (A) 01/20/2018 2142   APPEARANCEUR CLEAR 01/20/2018 2142   LABSPEC >1.046 (H)  01/20/2018 2142   PHURINE 5.0 01/20/2018 2142   GLUCOSEU NEGATIVE 01/20/2018 2142   HGBUR SMALL (A) 01/20/2018 2142   BILIRUBINUR NEGATIVE 01/20/2018 2142   KETONESUR 20 (A) 01/20/2018 2142   PROTEINUR 30 (A) 01/20/2018 2142   NITRITE NEGATIVE 01/20/2018 2142   LEUKOCYTESUR NEGATIVE 01/20/2018 2142    Radiological Exams on Admission: I have personally reviewed images CT Angio Chest PE W and/or Wo Contrast Result Date: 01/24/2024 CLINICAL DATA:  High probability for PE. EXAM: CT ANGIOGRAPHY CHEST WITH CONTRAST TECHNIQUE: Multidetector CT imaging of the chest was performed using the standard protocol during bolus administration of intravenous contrast. Multiplanar CT image reconstructions and MIPs were obtained to evaluate the vascular anatomy. RADIATION DOSE REDUCTION: This exam was performed according to the departmental dose-optimization program which includes automated exposure control, adjustment of the mA and/or kV according to patient size and/or use of iterative reconstruction technique. CONTRAST:  75mL OMNIPAQUE  IOHEXOL  350 MG/ML SOLN COMPARISON:  None Available. FINDINGS: Cardiovascular: Satisfactory opacification of the pulmonary arteries to the segmental level. No evidence of pulmonary embolism. Normal heart size. No pericardial effusion. Mediastinum/Nodes: There are nonenlarged mediastinal lymph nodes diffusely. There is an enlarged subcarinal lymph node measuring 11 mm short axis. There are enlarged left hilar lymph nodes measuring 10 mm. There is an enlarged right hilar lymph node measuring 13 mm. The visualized thyroid gland and esophagus are within normal limits. There is a small hiatal hernia. Lungs/Pleura: There are multifocal and confluence areas of ground-glass opacities throughout both lungs most significant in the bilateral upper lobes and superior segment of the lower lobes where there some airspace consolidation. Trachea and central airways are patent. There is no pleural effusion  or pneumothorax. Upper Abdomen: No acute abnormality. Musculoskeletal: No chest wall abnormality. No acute or significant osseous findings. Review of the MIP images confirms  the above findings. IMPRESSION: 1. No evidence for pulmonary embolism. 2. Multifocal and confluent areas of ground-glass opacities throughout both lungs most significant in the bilateral upper lobes and superior segment of the lower lobes where there is some airspace consolidation. Findings are compatible with multifocal pneumonia. Pulmonary edema and hemorrhage could have a similar appearance. 3. Mediastinal and hilar lymphadenopathy, likely reactive. 4. Small hiatal hernia. Electronically Signed   By: Greig Pique M.D.   On: 01/24/2024 19:09   DG Chest 2 View Result Date: 01/24/2024 CLINICAL DATA:  Shortness of breath, productive cough. EXAM: CHEST - 2 VIEW COMPARISON:  April 05, 2014. FINDINGS: Stable cardiomediastinal silhouette. Right upper lobe airspace opacity is noted concerning for pneumonia or possible atelectasis. Nodular density is noted in left upper lobe concerning for possible mass. Bony thorax is unremarkable. IMPRESSION: Right upper lobe airspace opacity is noted concerning for possible pneumonia. Nodular density is noted in left upper lobe concerning for possible neoplasm. CT scan of the chest is recommended for further evaluation. Electronically Signed   By: Lynwood Landy Raddle M.D.   On: 01/24/2024 16:33     EKG: My personal interpretation of EKG shows: EKG showing atrial fibrillation heart rate 107, nonspecific ST-T wave abnormality in lead to 3 and lateral lead V5 to V6.    Assessment/Plan: Principal Problem:   CAP (community acquired pneumonia) Active Problems:   Acute hypoxic respiratory failure (HCC)   Influenza A H1N1 infection   Hyponatremia   Hypokalemia   AKI (acute kidney injury) (HCC)   Alcohol withdrawal (HCC)   Paroxysmal atrial fibrillation (HCC)   Elevated troponin   Left leg pain     Assessment and Plan: Acute hypoxic respiratory failure secondary to community-acquired pneumonia and influenza Multifocal pneumonia SIRS > Patient presenting with complaining of fever, mild edema, cough, productive sputum and chills.  In the ED patient desatted to 80% and currently on 5 L oxygen O2 sat 92 to 94% - Afebrile.  Patient is tachypneic and borderline hypotensive.  As well as tachycardic which meets SIRS criteria.  Patient has no leukocytosis. -Respiratory panel positive with influenza A. - Pending blood culture, respiratory culture and urine Legionella and strep antigen. - Chest x-ray showing right upper lobe concerning for pneumonia and nodular density on the left upper lobe concerning for possible neoplasm - CTA chest no evidence of pulmonary embolism.  It showed multifocal and confluent areas of groundglass opacity throughout the both lung field significance for bilateral upper lobe and superior segment lower lobe consolidation-multifocal pneumonia -In the ED patient has been treated with IV ceftriaxone  and azithromycin . -Continue IV ceftriaxone , IV vancomycin twice daily and starting Tamiflu . -Will follow-up with culture result for antibiotic guidance - Continue supplemental oxygen and wean down as patient tolerates - Continue Xopenex  as needed for wheezing shortness of breath, flutter valve, spirometry and supportive care.   Questionable neoplasm-ruled out by imaging -Chest x-ray showed questionable neoplasm however CTA chest showed multifocal pneumonia and there is no evidence for neoplasm.  Concern for neoplasm has been ruled out by imaging. However given patient is a chronically smoker he will need low-dose chest CT annually.  Acute alcohol withdrawal -Patient reported drinking 4 to 6 cans of beer on daily basis however once he is saying that he has not not drinking for 2 weeks then saying that he last drink was 2 days ago. - Pending blood alcohol level - Continue CIWA  protocol with Ativan  taper as needed.  Paroxysmal atrial fibrillation-secondary to dehydration, alcohol  withdrawal and in the setting of hypoxia -Paroxysmal atrial fibrillation-converted to sinus rhythm -Initial presentation to ED patient found tachycardic heart rate 103.  EKG showing atrial fibrillation heart rate 107 - Currently patient heart rate has been improved to 84 and converted to sinus rhythm - Paroxysmal atrial fibrillation in the setting of dehydration, alcohol withdrawal pneumonia influenza and hypoxia - Continue to monitor. - Continue Lopressor  as needed for heart rate above 120 - If patient develop persistent atrial fibrillation will start IV amnio bolus and drip. - Obtain echocardiogram. -Discussed case with on-call cardiology Dr. Shanon, per discussion pericentral fibrillation in the setting of dehydration, alcohol withdrawal and hypoxia.  No need for treated unless patient developed A-fib RVR.  Also patient advised used to score is 0.  Stated that can treat with low-dose Lopressor .  -Continue cardiac monitoring.  Hyponatremia Hypokalemia -CMP showing low sodium 131, low potassium 2.9. -Hypokalemia and hyponatremia in the setting of alcohol use. -In the ED patient has been given 2 L of NS bolus. - Continue NS 100 cc/h - Patient has been repleted with oral KCl 14M EQ x 2 and IV KCl 10 mEq.  Pending mag level.  Continue to monitor electrolytes and replete as needed  Prerenal acute kidney injury -Creatinine 1.12 on presentation.  Baseline normal renal function.  Acute kidney injury/dehydration in the setting of alcohol use and hypoxia. - Resuscitated with 2 L of NS bolus currently on NS 100 cc/h. -Continue to monitor renal function and urine output. -Checking UA, urine creatinine and sodium.  Elevated troponin secondary to demand ischemia -Initial troponin 38.  Pending second troponin level.  Patient denies any chest pain or shortness of breath.  Complaining about chest  congestion.  EKG showing atrial fibrillation. -Elevated troponin secondary to demand ischemia in the setting of acute hypoxic respiratory failure.  There is no concern for acute coronary syndrome. - Continue to trend troponin, continue cardiac monitoring. -Pending echocardiogram.  If echocardiogram shows any wall motion abnormality in that case  will need to reach out to cardiology for formal consult.  Thrombocytopenia -Low platelet count 124.  Thrombocytopenia secondary to chronic alcohol use.  Continue to monitor.  Left-sided leg pain - Patient is complaining about left leg pain.  Physical exam unremarkable for any unequal swelling.  Bilateral lower extremities peripheral pedal pulse are 2+ and intact.   -Pending DVT ultrasound to rule out deep venous thrombosis.   Transaminitis - Elevated AST 124 normal ALT level.  Normal bilirubin level.  Low albumin 3.1.  Transaminitis secondary to chronic alcohol use. - Checking hepatitis panel to rule out any viral hepatitis.  Chronic smoking cigarette - Patient reported currently smoking 10 to 12 cigarettes in a day.  Counseled at the bedside for smoking cessation in the future. - Holding nicotine  in the setting of elevated troponin.  As needed can contribute to coronary vascular spasm.   DVT prophylaxis:  Lovenox  Code Status:  Full Code Diet: Heart healthy diet Family Communication: No family member present at bedside Disposition Plan: Continue to monitor improvement of rapid respiratory failure.  Wean down oxygen as patient tolerates.  Pending echocardiogram. Consults: None at this time. Admission status:   Inpatient, Telemetry bed  Severity of Illness: The appropriate patient status for this patient is INPATIENT. Inpatient status is judged to be reasonable and necessary in order to provide the required intensity of service to ensure the patient's safety. The patient's presenting symptoms, physical exam findings, and initial radiographic and  laboratory data in the  context of their chronic comorbidities is felt to place them at high risk for further clinical deterioration. Furthermore, it is not anticipated that the patient will be medically stable for discharge from the hospital within 2 midnights of admission.   * I certify that at the point of admission it is my clinical judgment that the patient will require inpatient hospital care spanning beyond 2 midnights from the point of admission due to high intensity of service, high risk for further deterioration and high frequency of surveillance required.Xavier    Cannan Beeck, MD [facility]  How to contact the [facility] Attending or Consulting provider 7A - 7P or covering provider during after hours 7P -7A, for this patient.  Check the care team in [facility] and look for a) attending/consulting [facility] provider listed and b) the [facility] team listed Log into [email or web address] and use [place]'s universal password to access. If you do not have the password, please contact the hospital operator. Locate the [facility] provider you are looking for under [facility] and page to a number that you can be directly reached. If you still have difficulty reaching the provider, please page the [facility] (Director on Call) for the Hospitalists listed on amion for assistance.  01/24/2024, 9:55 PM

## 2024-01-24 NOTE — ED Notes (Signed)
 Sats 90 nasal 02 increased to 4 liters of 02

## 2024-01-24 NOTE — ED Notes (Signed)
 Pt in extreme pain  he has not eaten in 5 days  unable to get foods down

## 2024-01-24 NOTE — ED Notes (Signed)
 The pt reports no alcohol for 2 weeks

## 2024-01-24 NOTE — ED Triage Notes (Signed)
 Pt reports posterior left leg pain. Upon arrival to triage room pts RA sat 84%. Pt placed on O2 and now at 92%.

## 2024-01-24 NOTE — ED Notes (Signed)
 All labs sent including a bb to hold clot  labs not allowihg me to click them off

## 2024-01-25 ENCOUNTER — Inpatient Hospital Stay (HOSPITAL_COMMUNITY): Payer: 59

## 2024-01-25 DIAGNOSIS — J9601 Acute respiratory failure with hypoxia: Secondary | ICD-10-CM | POA: Diagnosis not present

## 2024-01-25 DIAGNOSIS — M7989 Other specified soft tissue disorders: Secondary | ICD-10-CM

## 2024-01-25 DIAGNOSIS — I4891 Unspecified atrial fibrillation: Secondary | ICD-10-CM | POA: Diagnosis not present

## 2024-01-25 LAB — COMPREHENSIVE METABOLIC PANEL
ALT: 37 U/L (ref 0–44)
AST: 126 U/L — ABNORMAL HIGH (ref 15–41)
Albumin: 2.5 g/dL — ABNORMAL LOW (ref 3.5–5.0)
Alkaline Phosphatase: 59 U/L (ref 38–126)
Anion gap: 11 (ref 5–15)
BUN: 16 mg/dL (ref 6–20)
CO2: 18 mmol/L — ABNORMAL LOW (ref 22–32)
Calcium: 7.7 mg/dL — ABNORMAL LOW (ref 8.9–10.3)
Chloride: 105 mmol/L (ref 98–111)
Creatinine, Ser: 0.89 mg/dL (ref 0.61–1.24)
GFR, Estimated: >60 mL/min (ref >60)
Glucose, Bld: 118 mg/dL — ABNORMAL HIGH (ref 70–99)
Potassium: 3 mmol/L — ABNORMAL LOW (ref 3.5–5.1)
Sodium: 134 mmol/L — ABNORMAL LOW (ref 135–145)
Total Bilirubin: 0.6 mg/dL (ref 0.0–1.2)
Total Protein: 5.4 g/dL — ABNORMAL LOW (ref 6.5–8.1)

## 2024-01-25 LAB — CBC
HCT: 39.6 % (ref 39.0–52.0)
Hemoglobin: 14.5 g/dL (ref 13.0–17.0)
MCH: 35.6 pg — ABNORMAL HIGH (ref 26.0–34.0)
MCHC: 36.6 g/dL — ABNORMAL HIGH (ref 30.0–36.0)
MCV: 97.3 fL (ref 80.0–100.0)
Platelets: 107 10*3/uL — ABNORMAL LOW (ref 150–400)
RBC: 4.07 MIL/uL — ABNORMAL LOW (ref 4.22–5.81)
RDW: 12.1 % (ref 11.5–15.5)
WBC: 4.1 10*3/uL (ref 4.0–10.5)
nRBC: 0 % (ref 0.0–0.2)

## 2024-01-25 LAB — URINALYSIS, ROUTINE W REFLEX MICROSCOPIC
Bacteria, UA: NONE SEEN
Bilirubin Urine: NEGATIVE
Glucose, UA: NEGATIVE mg/dL
Ketones, ur: NEGATIVE mg/dL
Leukocytes,Ua: NEGATIVE
Nitrite: NEGATIVE
Protein, ur: 100 mg/dL — AB
Specific Gravity, Urine: 1.032 — ABNORMAL HIGH (ref 1.005–1.030)
pH: 5 (ref 5.0–8.0)

## 2024-01-25 LAB — CREATININE, URINE, RANDOM: Creatinine, Urine: 107 mg/dL

## 2024-01-25 LAB — ECHOCARDIOGRAM COMPLETE
Area-P 1/2: 4.06 cm2
S' Lateral: 3.2 cm

## 2024-01-25 LAB — HEPATITIS PANEL, ACUTE
HCV Ab: NONREACTIVE
Hep A IgM: NONREACTIVE
Hep B C IgM: NONREACTIVE
Hepatitis B Surface Ag: NONREACTIVE

## 2024-01-25 LAB — BLOOD GAS, VENOUS
Acid-base deficit: 2.7 mmol/L — ABNORMAL HIGH (ref 0.0–2.0)
Bicarbonate: 20.4 mmol/L (ref 20.0–28.0)
O2 Saturation: 96.8 %
Patient temperature: 37
pCO2, Ven: 30 mm[Hg] — ABNORMAL LOW (ref 44–60)
pH, Ven: 7.44 — ABNORMAL HIGH (ref 7.25–7.43)
pO2, Ven: 73 mm[Hg] — ABNORMAL HIGH (ref 32–45)

## 2024-01-25 LAB — HIV ANTIBODY (ROUTINE TESTING W REFLEX): HIV Screen 4th Generation wRfx: NONREACTIVE

## 2024-01-25 LAB — SODIUM, URINE, RANDOM: Sodium, Ur: 32 mmol/L

## 2024-01-25 LAB — PROCALCITONIN: Procalcitonin: 0.37 ng/mL

## 2024-01-25 LAB — STREP PNEUMONIAE URINARY ANTIGEN: Strep Pneumo Urinary Antigen: NEGATIVE

## 2024-01-25 MED ORDER — IPRATROPIUM-ALBUTEROL 0.5-2.5 (3) MG/3ML IN SOLN
3.0000 mL | RESPIRATORY_TRACT | Status: DC | PRN
  Administered 2024-01-31 – 2024-02-03 (×2): 3 mL via RESPIRATORY_TRACT
  Filled 2024-01-25: qty 3

## 2024-01-25 MED ORDER — POTASSIUM CHLORIDE CRYS ER 20 MEQ PO TBCR: 40.0000 meq | EXTENDED_RELEASE_TABLET | Freq: Once | ORAL | Status: DC

## 2024-01-25 MED ORDER — MAGNESIUM SULFATE 2 GM/50ML IV SOLN
2.0000 g | Freq: Once | INTRAVENOUS | Status: AC
  Administered 2024-01-25: 2 g via INTRAVENOUS
  Filled 2024-01-25: qty 50

## 2024-01-25 MED ORDER — LEVALBUTEROL HCL 0.63 MG/3ML IN NEBU
0.6300 mg | INHALATION_SOLUTION | Freq: Four times a day (QID) | RESPIRATORY_TRACT | Status: DC
  Administered 2024-01-25 – 2024-01-27 (×11): 0.63 mg via RESPIRATORY_TRACT
  Filled 2024-01-25 (×11): qty 3

## 2024-01-25 MED ORDER — OXYCODONE HCL 5 MG PO TABS
5.0000 mg | ORAL_TABLET | Freq: Four times a day (QID) | ORAL | Status: DC | PRN
  Administered 2024-01-26 – 2024-02-05 (×11): 5 mg via ORAL
  Filled 2024-01-25 (×13): qty 1

## 2024-01-25 MED ORDER — GABAPENTIN 300 MG PO CAPS
300.0000 mg | ORAL_CAPSULE | Freq: Three times a day (TID) | ORAL | Status: DC
  Administered 2024-01-25 – 2024-02-06 (×35): 300 mg via ORAL
  Filled 2024-01-25 (×35): qty 1

## 2024-01-25 MED ORDER — FLORANEX PO PACK
1.0000 g | PACK | Freq: Three times a day (TID) | ORAL | Status: DC
  Administered 2024-01-26 – 2024-02-03 (×22): 1 g via ORAL
  Filled 2024-01-25 (×29): qty 1

## 2024-01-25 MED ORDER — MORPHINE SULFATE (PF) 2 MG/ML IV SOLN
2.0000 mg | INTRAVENOUS | Status: DC | PRN
  Administered 2024-01-25 – 2024-02-06 (×12): 2 mg via INTRAVENOUS
  Filled 2024-01-25 (×12): qty 1

## 2024-01-25 MED ORDER — POTASSIUM CHLORIDE CRYS ER 20 MEQ PO TBCR
40.0000 meq | EXTENDED_RELEASE_TABLET | Freq: Once | ORAL | Status: AC
  Administered 2024-01-25: 40 meq via ORAL
  Filled 2024-01-25: qty 2

## 2024-01-25 MED ORDER — IPRATROPIUM BROMIDE 0.02 % IN SOLN
0.5000 mg | Freq: Four times a day (QID) | RESPIRATORY_TRACT | Status: DC
  Administered 2024-01-25 – 2024-01-27 (×11): 0.5 mg via RESPIRATORY_TRACT
  Filled 2024-01-25 (×11): qty 2.5

## 2024-01-25 MED ORDER — POTASSIUM CHLORIDE IN NACL 40-0.9 MEQ/L-% IV SOLN
INTRAVENOUS | Status: AC
  Filled 2024-01-25 (×2): qty 1000

## 2024-01-25 NOTE — Progress Notes (Signed)
  Echocardiogram 2D Echocardiogram has been performed.  Farley Honer 01/25/2024, 3:02 PM

## 2024-01-25 NOTE — Progress Notes (Signed)
 I was notified of pt with acute desaturations to 70s while on NRB mask. Upon my arrival, oxygen was not connected to NRB mask so pt was properly placed on NRB mask at 15L. His saturations improved to 92-94% on NRB mask. BBS clear. No distress. Very minimal abdominal accessory muscle use. Speaking clearly in sentences without stress. Pt denies SOB except when coughing. Pt will be placed on HHFNC for hypoxia since he has minimal WOB. Dr. Sundil at bedside.

## 2024-01-25 NOTE — Progress Notes (Signed)
  Progress Note   Patient: Xavier [name] FMW:[id number] DOB: [birth year] DOA: 01/24/2024     1 DOS: the patient was seen and examined on 01/25/2024    Assessment and Plan: Acute hypoxic respiratory failure secondary to community-acquired pneumonia and influenza Multifocal pneumonia SIRS - Currently maxed on hi-flow (can use BIPAP if required) - Tamiflu  75 mg PO bid  - IV ceftriaxone  2 g daily  - IV doxycyline 100 mg q12  - Mucinex  600 mg PO bid   - Atrovent  q6 hr  - Xopenex  q6 hr  - Duoneb q4 hr PRN   Acute alcohol withdrawal - Folic acid  1 mg PO daily  - MVI PO daily  - Thiamine  100 mg daily  - Gabapentin  300 mg PO tid  - Ativan  PRN w/ CIWA   Paroxysmal atrial fibrillation-secondary to dehydration, alcohol withdrawal and in the setting of hypoxia - Appears resolved    Hyponatremia & Hypokalemia - IV NS w/ 40K+ @ 100 cc/hr   Prerenal acute kidney injury - Resolved    Elevated troponin secondary to demand ischemia -Elevated troponin secondary to demand ischemia in the setting of acute hypoxic respiratory failure -Pending echocardiogram.  If echocardiogram shows any wall motion abnormality in that case  will need to reach out to cardiology for formal consult.   Thrombocytopenia - Monitor    Left-sided leg pain - Patient is complaining about left leg pain.  Physical exam unremarkable for any unequal swelling.  Bilateral lower extremities peripheral pedal pulse are 2+ and intact.   -Pending DVT ultrasound to rule out deep venous thrombosis - Oxycodone  5 mg PO q6 hr PRN (moderate pain) - IV morphine  2 mg q4 hr (severe pain) - Gabapentin  as above    Transaminitis - Monitor    Chronic smoking cigarette - Holding nicotine  in the setting of elevated troponin.  As needed can contribute to coronary vascular spasm.   Diarrhea - GI panel and c.diff ordered  - Lactobacillus 1 g PO tid   Subjective: Pt seen and examined at the bedside. He is maxed on the high flow. IV fluids  changed to NS with K+ due to persistent hypokalemia this morning. He remains in the hospital for oxygen support and IV antibx.  Physical Exam: Vitals:   01/25/24 0411 01/25/24 0420 01/25/24 0918 01/25/24 1035  BP:    111/70  Pulse:  68  66  Resp: (!) 25 (!) 25  20  Temp: 98.2 F (36.8 C) 98.2 F (36.8 C)  98.5 F (36.9 C)  TempSrc: Oral Oral  Oral  SpO2: 94% 93% 90% 90%   Physical Exam HENT:     Head: Normocephalic.  Cardiovascular:     Rate and Rhythm: Normal rate and regular rhythm.  Pulmonary:     Effort: Pulmonary effort is normal.  Abdominal:     Palpations: Abdomen is soft.  Skin:    General: Skin is warm.  Neurological:     Mental Status: He is alert and oriented to person, place, and time.  Psychiatric:        Mood and Affect: Mood normal.      Disposition: Status is: Inpatient Remains inpatient appropriate because: Oxygen support and IV antibx   Planned Discharge Destination: Home    Time spent: 35 minutes  Author: Muhamad Serano , MD 01/25/2024 10:46 AM  For on call review [email or web address].

## 2024-01-25 NOTE — Plan of Care (Signed)
   Problem: Safety: Goal: Ability to remain free from injury will improve Outcome: Progressing

## 2024-01-25 NOTE — Progress Notes (Signed)
 Lower extremity venous duplex completed. Please see CV Procedures for preliminary results.  Estanislao Heimlich, RVT 01/25/24 11:18 AM

## 2024-01-25 NOTE — Progress Notes (Addendum)
 Patient desatted to 88% on 5 L oxygen.  Currently requiring 15 L high flow oxygen O2 sat 92%. Patient has been admitted for pneumonia.  Also he has history of chronic smoking but no formal diagnosis of COPD.  Physical exam no evidence of wheezing, rhonchi and rales. -Given patient would requirement is going up checking VBG.  Also starting Xopenex  and Atrovent  every 6 hours scheduled and continue DuoNeb as needed if for any wheezing shortness of breath. -At the bedside during evaluation found out that patient is not connected with high flow oxygen and when he turned around somehow it was disconnected.  Which is why O2 sat has been dropped to 78 to 80%. -Rapid response team at the bedside.   I will exam no evidence of rhonchi, wheezing and rales.  Patient does not have any respiratory distress.  Plan to continue high flow heated nonrebreather facemask 10 to 15 L to keep O2 sat above 92%.  If patient desats on high flow heated nonrebreather in that case need to place on BiPAP.   Chrstopher Malenfant, MD [facility] 01/25/2024, 1:30 AM

## 2024-01-26 DIAGNOSIS — J9601 Acute respiratory failure with hypoxia: Secondary | ICD-10-CM | POA: Diagnosis not present

## 2024-01-26 LAB — COMPREHENSIVE METABOLIC PANEL
ALT: 32 U/L (ref 0–44)
AST: 106 U/L — ABNORMAL HIGH (ref 15–41)
Albumin: 2.3 g/dL — ABNORMAL LOW (ref 3.5–5.0)
Alkaline Phosphatase: 64 U/L (ref 38–126)
Anion gap: 13 (ref 5–15)
BUN: 9 mg/dL (ref 6–20)
CO2: 18 mmol/L — ABNORMAL LOW (ref 22–32)
Calcium: 7.8 mg/dL — ABNORMAL LOW (ref 8.9–10.3)
Chloride: 106 mmol/L (ref 98–111)
Creatinine, Ser: 0.76 mg/dL (ref 0.61–1.24)
GFR, Estimated: >60 mL/min (ref >60)
Glucose, Bld: 114 mg/dL — ABNORMAL HIGH (ref 70–99)
Potassium: 3.5 mmol/L (ref 3.5–5.1)
Sodium: 137 mmol/L (ref 135–145)
Total Bilirubin: 0.5 mg/dL (ref 0.0–1.2)
Total Protein: 5.1 g/dL — ABNORMAL LOW (ref 6.5–8.1)

## 2024-01-26 LAB — CBC
HCT: 37.4 % — ABNORMAL LOW (ref 39.0–52.0)
Hemoglobin: 13.3 g/dL (ref 13.0–17.0)
MCH: 35.8 pg — ABNORMAL HIGH (ref 26.0–34.0)
MCHC: 35.6 g/dL (ref 30.0–36.0)
MCV: 100.5 fL — ABNORMAL HIGH (ref 80.0–100.0)
Platelets: 136 10*3/uL — ABNORMAL LOW (ref 150–400)
RBC: 3.72 MIL/uL — ABNORMAL LOW (ref 4.22–5.81)
RDW: 12.7 % (ref 11.5–15.5)
WBC: 8.4 10*3/uL (ref 4.0–10.5)
nRBC: 0 % (ref 0.0–0.2)

## 2024-01-26 LAB — MAGNESIUM: Magnesium: 2.1 mg/dL (ref 1.7–2.4)

## 2024-01-26 LAB — PHOSPHORUS: Phosphorus: 2 mg/dL — ABNORMAL LOW (ref 2.5–4.6)

## 2024-01-26 LAB — C DIFFICILE QUICK SCREEN W PCR REFLEX
C Diff antigen: NEGATIVE
C Diff interpretation: NOT DETECTED
C Diff toxin: NEGATIVE

## 2024-01-26 LAB — C-REACTIVE PROTEIN: CRP: 9.1 mg/dL — ABNORMAL HIGH (ref <1.0)

## 2024-01-26 MED ORDER — LORAZEPAM 2 MG/ML IJ SOLN
1.0000 mg | INTRAMUSCULAR | Status: DC | PRN
  Administered 2024-01-26: 1 mg via INTRAVENOUS
  Filled 2024-01-26: qty 1

## 2024-01-26 MED ORDER — DOXYCYCLINE HYCLATE 100 MG PO TABS
100.0000 mg | ORAL_TABLET | Freq: Two times a day (BID) | ORAL | Status: DC
  Administered 2024-01-26 – 2024-01-27 (×2): 100 mg via ORAL
  Filled 2024-01-26 (×2): qty 1

## 2024-01-26 MED ORDER — POTASSIUM CHLORIDE IN NACL 40-0.9 MEQ/L-% IV SOLN
INTRAVENOUS | Status: DC
  Filled 2024-01-26: qty 1000

## 2024-01-26 MED ORDER — POTASSIUM PHOSPHATES 15 MMOLE/5ML IV SOLN
30.0000 mmol | Freq: Once | INTRAVENOUS | Status: AC
  Administered 2024-01-26: 30 mmol via INTRAVENOUS
  Filled 2024-01-26: qty 10

## 2024-01-26 MED ORDER — NICOTINE 21 MG/24HR TD PT24
21.0000 mg | MEDICATED_PATCH | Freq: Every day | TRANSDERMAL | Status: DC
  Filled 2024-01-26 (×4): qty 1

## 2024-01-26 MED ORDER — FUROSEMIDE 10 MG/ML IJ SOLN
40.0000 mg | Freq: Once | INTRAMUSCULAR | Status: AC
  Administered 2024-01-26: 40 mg via INTRAVENOUS
  Filled 2024-01-26: qty 4

## 2024-01-26 NOTE — Progress Notes (Signed)
  Progress Note   Patient: Xavier [name] FMW:[id number] DOB: 11-23-1966 DOA: 01/24/2024     2 DOS: the patient was seen and examined on 01/26/2024    Assessment and Plan: Acute hypoxic respiratory failure secondary to community-acquired pneumonia and influenza Multifocal pneumonia SIRS - Continue hi-flow and BIPAP PRN - Tamiflu  75 mg PO bid  - IV ceftriaxone  2 g daily  - IV doxycyline 100 mg q12  - Mucinex  600 mg PO bid   - Atrovent  q6 hr  - Xopenex  q6 hr  - Duoneb q4 hr PRN    Acute alcohol withdrawal - Folic acid  1 mg PO daily  - MVI PO daily  - Thiamine  100 mg daily  - Gabapentin  300 mg PO tid  - Ativan  PRN w/ CIWA   Paroxysmal atrial fibrillation-secondary to dehydration, alcohol withdrawal and in the setting of hypoxia - Appears resolved    Hyponatremia & Hypokalemia - Resolved   Prerenal acute kidney injury - Resolved    Elevated troponin secondary to demand ischemia -Elevated troponin secondary to demand ischemia in the setting of acute hypoxic respiratory failure - ECHO wnl LVEF 55-60% and no WMA's   Thrombocytopenia - Monitor --> improving    Left-sided leg pain - Patient is complaining about left leg pain.  Physical exam unremarkable for any unequal swelling.  Bilateral lower extremities peripheral pedal pulse are 2+ and intact.   - DVT U/S is negative for DVT - Oxycodone  5 mg PO q6 hr PRN (moderate pain) - IV morphine  2 mg q4 hr (severe pain) - Gabapentin  as above    Transaminitis - Monitor -->improving    Chronic smoking cigarette - Nicotine  patch ordered   Diarrhea - GI panel and c.diff ordered but still not collected  - Lactobacillus 1 g PO tid   Subjective: Pt seen and examined at the bedside. Pt was taking his BIPAP off overnight because he wanted to drink something. He was on hiflow this morning but his O2 sat are in the 80-85% range. Thus, around 1030am today he was placed back on BIPAP. IV fluids stopped and IV lasix  40 mg x 1 ordered this  morning.  Physical Exam: Vitals:   01/26/24 0149 01/26/24 0305 01/26/24 0816 01/26/24 0907  BP:  115/74  115/65  Pulse: 70 74  79  Resp: (!) 32 (!) 26  20  Temp:  98.4 F (36.9 C)  (!) 97.4 F (36.3 C)  TempSrc:  Oral  Oral  SpO2: 92% 93% (!) 89% 90%   Physical Exam HENT:     Head: Normocephalic.  Cardiovascular:     Rate and Rhythm: Normal rate and regular rhythm.  Pulmonary:     Effort: Tachypnea present.  Abdominal:     Palpations: Abdomen is soft.  Skin:    General: Skin is warm.  Neurological:     Mental Status: He is alert and oriented to person, place, and time.  Psychiatric:        Mood and Affect: Mood normal.        Disposition: Status is: Inpatient Remains inpatient appropriate because: BIPAP and IV antibx w/ breathing tx  Planned Discharge Destination:  Dispo per pt's clinical progress    Time spent: 35 minutes  Author: Florice Hindle , MD 01/26/2024 10:28 AM  For on call review [email or web address].

## 2024-01-27 ENCOUNTER — Inpatient Hospital Stay (HOSPITAL_COMMUNITY): Payer: 59

## 2024-01-27 DIAGNOSIS — J09X1 Influenza due to identified novel influenza A virus with pneumonia: Secondary | ICD-10-CM | POA: Diagnosis not present

## 2024-01-27 DIAGNOSIS — E876 Hypokalemia: Secondary | ICD-10-CM | POA: Diagnosis not present

## 2024-01-27 DIAGNOSIS — I4891 Unspecified atrial fibrillation: Secondary | ICD-10-CM | POA: Diagnosis not present

## 2024-01-27 DIAGNOSIS — J9601 Acute respiratory failure with hypoxia: Secondary | ICD-10-CM | POA: Diagnosis not present

## 2024-01-27 LAB — COMPREHENSIVE METABOLIC PANEL
ALT: 31 U/L (ref 0–44)
AST: 70 U/L — ABNORMAL HIGH (ref 15–41)
Albumin: 2.3 g/dL — ABNORMAL LOW (ref 3.5–5.0)
Alkaline Phosphatase: 70 U/L (ref 38–126)
Anion gap: 16 — ABNORMAL HIGH (ref 5–15)
BUN: 8 mg/dL (ref 6–20)
CO2: 21 mmol/L — ABNORMAL LOW (ref 22–32)
Calcium: 8.3 mg/dL — ABNORMAL LOW (ref 8.9–10.3)
Chloride: 101 mmol/L (ref 98–111)
Creatinine, Ser: 0.77 mg/dL (ref 0.61–1.24)
GFR, Estimated: >60 mL/min (ref >60)
Glucose, Bld: 106 mg/dL — ABNORMAL HIGH (ref 70–99)
Potassium: 3.4 mmol/L — ABNORMAL LOW (ref 3.5–5.1)
Sodium: 138 mmol/L (ref 135–145)
Total Bilirubin: 0.9 mg/dL (ref 0.0–1.2)
Total Protein: 5.6 g/dL — ABNORMAL LOW (ref 6.5–8.1)

## 2024-01-27 LAB — GASTROINTESTINAL PANEL BY PCR, STOOL (REPLACES STOOL CULTURE)

## 2024-01-27 LAB — BASIC METABOLIC PANEL
Anion gap: 12 (ref 5–15)
BUN: 10 mg/dL (ref 6–20)
CO2: 24 mmol/L (ref 22–32)
Calcium: 8.2 mg/dL — ABNORMAL LOW (ref 8.9–10.3)
Chloride: 101 mmol/L (ref 98–111)
Creatinine, Ser: 0.7 mg/dL (ref 0.61–1.24)
GFR, Estimated: >60 mL/min (ref >60)
Glucose, Bld: 124 mg/dL — ABNORMAL HIGH (ref 70–99)
Potassium: 3.6 mmol/L (ref 3.5–5.1)
Sodium: 137 mmol/L (ref 135–145)

## 2024-01-27 LAB — MAGNESIUM
Magnesium: 2.1 mg/dL (ref 1.7–2.4)
Magnesium: 2.1 mg/dL (ref 1.7–2.4)

## 2024-01-27 LAB — LEGIONELLA PNEUMOPHILA SEROGP 1 UR AG: L. pneumophila Serogp 1 Ur Ag: NEGATIVE

## 2024-01-27 LAB — CBC
HCT: 37.3 % — ABNORMAL LOW (ref 39.0–52.0)
Hemoglobin: 13.3 g/dL (ref 13.0–17.0)
MCH: 35.5 pg — ABNORMAL HIGH (ref 26.0–34.0)
MCHC: 35.7 g/dL (ref 30.0–36.0)
MCV: 99.5 fL (ref 80.0–100.0)
Platelets: 156 10*3/uL (ref 150–400)
RBC: 3.75 MIL/uL — ABNORMAL LOW (ref 4.22–5.81)
RDW: 12.7 % (ref 11.5–15.5)
WBC: 12.2 10*3/uL — ABNORMAL HIGH (ref 4.0–10.5)
nRBC: 0 % (ref 0.0–0.2)

## 2024-01-27 LAB — C-REACTIVE PROTEIN: CRP: 15.8 mg/dL — ABNORMAL HIGH (ref <1.0)

## 2024-01-27 LAB — GLUCOSE, CAPILLARY
Glucose-Capillary: 117 mg/dL — ABNORMAL HIGH (ref 70–99)
Glucose-Capillary: 138 mg/dL — ABNORMAL HIGH (ref 70–99)

## 2024-01-27 LAB — MRSA NEXT GEN BY PCR, NASAL: MRSA by PCR Next Gen: NOT DETECTED

## 2024-01-27 LAB — PHOSPHORUS: Phosphorus: 2.8 mg/dL (ref 2.5–4.6)

## 2024-01-27 MED ORDER — PIPERACILLIN-TAZOBACTAM 3.375 G IVPB 30 MIN
3.3750 g | Freq: Three times a day (TID) | INTRAVENOUS | Status: DC
  Administered 2024-01-27 – 2024-01-29 (×7): 3.375 g via INTRAVENOUS
  Filled 2024-01-27 (×7): qty 50

## 2024-01-27 MED ORDER — SODIUM CHLORIDE 0.9 % IV SOLN
100.0000 mg | Freq: Two times a day (BID) | INTRAVENOUS | Status: DC
  Administered 2024-01-27 – 2024-01-28 (×3): 100 mg via INTRAVENOUS
  Filled 2024-01-27 (×4): qty 100

## 2024-01-27 MED ORDER — PHENOBARBITAL SODIUM 65 MG/ML IJ SOLN
32.5000 mg | Freq: Three times a day (TID) | INTRAMUSCULAR | Status: DC
  Administered 2024-01-31 – 2024-02-01 (×3): 32.5 mg via INTRAVENOUS
  Filled 2024-01-27 (×3): qty 1

## 2024-01-27 MED ORDER — PHENOBARBITAL SODIUM 65 MG/ML IJ SOLN
65.0000 mg | Freq: Three times a day (TID) | INTRAMUSCULAR | Status: AC
  Administered 2024-01-29 – 2024-01-31 (×6): 65 mg via INTRAVENOUS
  Filled 2024-01-27 (×6): qty 1

## 2024-01-27 MED ORDER — REVEFENACIN 175 MCG/3ML IN SOLN
175.0000 ug | Freq: Every day | RESPIRATORY_TRACT | Status: DC
  Administered 2024-01-27 – 2024-02-04 (×9): 175 ug via RESPIRATORY_TRACT
  Filled 2024-01-27 (×10): qty 3

## 2024-01-27 MED ORDER — POTASSIUM CHLORIDE CRYS ER 20 MEQ PO TBCR
40.0000 meq | EXTENDED_RELEASE_TABLET | Freq: Once | ORAL | Status: AC
  Administered 2024-01-27: 40 meq via ORAL
  Filled 2024-01-27: qty 2

## 2024-01-27 MED ORDER — FUROSEMIDE 10 MG/ML IJ SOLN
40.0000 mg | Freq: Once | INTRAMUSCULAR | Status: AC
  Administered 2024-01-27: 40 mg via INTRAVENOUS
  Filled 2024-01-27: qty 4

## 2024-01-27 MED ORDER — PHENOBARBITAL SODIUM 130 MG/ML IJ SOLN
97.5000 mg | Freq: Three times a day (TID) | INTRAMUSCULAR | Status: AC
  Administered 2024-01-27 – 2024-01-29 (×6): 97.5 mg via INTRAVENOUS
  Filled 2024-01-27 (×6): qty 1

## 2024-01-27 MED ORDER — CHLORHEXIDINE GLUCONATE CLOTH 2 % EX PADS
6.0000 | MEDICATED_PAD | Freq: Every day | CUTANEOUS | Status: DC
  Administered 2024-01-28 – 2024-02-03 (×5): 6 via TOPICAL

## 2024-01-27 MED ORDER — BUDESONIDE 0.5 MG/2ML IN SUSP
0.5000 mg | Freq: Two times a day (BID) | RESPIRATORY_TRACT | Status: DC
  Administered 2024-01-27 – 2024-02-05 (×18): 0.5 mg via RESPIRATORY_TRACT
  Filled 2024-01-27 (×19): qty 2

## 2024-01-27 MED ORDER — DILTIAZEM HCL-DEXTROSE 125-5 MG/125ML-% IV SOLN (PREMIX)
5.0000 mg/h | INTRAVENOUS | Status: DC
  Administered 2024-01-27: 5 mg/h via INTRAVENOUS
  Filled 2024-01-27: qty 125

## 2024-01-27 MED ORDER — METHYLPREDNISOLONE SODIUM SUCC 40 MG IJ SOLR
40.0000 mg | Freq: Every day | INTRAMUSCULAR | Status: DC
  Administered 2024-01-27 – 2024-01-30 (×4): 40 mg via INTRAVENOUS
  Filled 2024-01-27 (×4): qty 1

## 2024-01-27 MED ORDER — LORAZEPAM 2 MG/ML IJ SOLN: 1.0000 mg | INTRAMUSCULAR | Status: DC | PRN

## 2024-01-27 MED ORDER — ARFORMOTEROL TARTRATE 15 MCG/2ML IN NEBU
15.0000 ug | INHALATION_SOLUTION | Freq: Two times a day (BID) | RESPIRATORY_TRACT | Status: DC
  Administered 2024-01-27 – 2024-02-05 (×18): 15 ug via RESPIRATORY_TRACT
  Filled 2024-01-27 (×19): qty 2

## 2024-01-27 MED ORDER — GUAIFENESIN-DM 100-10 MG/5ML PO SYRP
10.0000 mL | ORAL_SOLUTION | Freq: Four times a day (QID) | ORAL | Status: DC
  Administered 2024-01-27 – 2024-01-29 (×5): 10 mL via ORAL
  Filled 2024-01-27 (×7): qty 10

## 2024-01-27 NOTE — Consult Note (Signed)
 NAME:  Xavier [name], MRN:  [id number], DOB:  1966/03/29, LOS: 3 ADMISSION DATE:  01/24/2024, CONSULTATION DATE:  01/27/2024 REFERRING MD:  D. Sira - TR, CHIEF COMPLAINT: Hypoxic respiratory failure secondary to influenza A  History of Present Illness:  Xavier [name] is a 58 year old male with a past medical history significant for tobacco and alcohol use.  Who presented to the ED 2/7 for complaints of shortness of breath and leg pain.  Patient was seen hypoxic with oxygen saturations 80% on 5 L nasal cannula on presentation.  Workup revealed patient was flu a positive.  Since admission patient has remained BiPAP dependent with short trials of heated high flow nasal cannula that failed quickly.  PCCM consulted for additional assistance in management 2/10.  Pertinent  Medical History  Recommend alcohol use  Significant Hospital Events: Including procedures, antibiotic start and stop dates in addition to other pertinent events   2/7 admitted for shortness of breath and leg pain found to be influenza A positive.  Interim History / Subjective:  Seen lying in bed fatigued on BiPAP  Objective   Blood pressure 111/76, pulse 95, temperature 98.5 F (36.9 C), temperature source Axillary, resp. rate (!) 30, weight 80.7 kg, SpO2 93%.    FiO2 (%):  [80 %-100 %] 80 % PEEP:  [8 cmH20] 8 cmH20   Intake/Output Summary (Last 24 hours) at 01/27/2024 1828 Last data filed at 01/27/2024 1827 Gross per 24 hour  Intake 529.36 ml  Output 1330 ml  Net -800.64 ml   Filed Weights   01/26/24 1756  Weight: 80.7 kg    Examination: General: Acute on chronic ill appearing middle aged male lying in bed in mild respiratory distress on BiPAP HEENT: [place]/AT, BiPAP mask in place, MM pink/moist, PERRL,  Neuro: Alert and oriented x 3, nonfocal CV: s1s2 regular rate and rhythm, no murmur, rubs, or gallops,  PULM: Tachypnea, diminished bilaterally, tolerating BiPAP, appears tired GI: soft, bowel sounds active in all  4 quadrants, non-tender, non-distended, tolerating oral diet Extremities: warm/dry, no edema  Skin: no rashes or lesions   Resolved Hospital Problem list     Assessment & Plan:  Acute hypoxic respiratory failure secondary to community-acquired pneumonia and influenza A Multifocal pneumonia Tobacco use P: Transfer to ICU for close monitoring of airway protection Threshold to intubate Continue BiPAP Start IV Solu-Medrol  Aspiration precautions Low threshold to intubate As needed bronchodilators Tamiflu  IV Zosyn   Atrial fibrillation with rapid ventricular response -Developed while trying to trial off BiPAP P: Cardizem  drip Continuous telemetry Optimize electrolytes Ventilation support as above  Hypokalemia P: Supplement as needed Trend bemet  Left-sided leg pain -Doppler negative P: As needed opioids for pain control  Daily alcohol use with transaminitis P: CIWA protocol Monitor for withdrawals Avoid hepatotoxins   Best Practice (right click and "Reselect all SmartList Selections" daily)   Diet/type: NPO DVT prophylaxis LMWH Pressure ulcer(s): N/A GI prophylaxis: PPI Lines: N/A Foley:  N/A Code Status:  full code Last date of multidisciplinary goals of care discussion: Pending   Labs   CBC: Recent Labs  Lab 01/24/24 1539 01/25/24 0257 01/26/24 0315 01/27/24 0330  WBC 3.4* 4.1 8.4 12.2*  NEUTROABS 2.9  --   --   --   HGB 16.2 14.5 13.3 13.3  HCT 44.5 39.6 37.4* 37.3*  MCV 96.9 97.3 100.5* 99.5  PLT 124* 107* 136* 156    Basic Metabolic Panel: Recent Labs  Lab 01/24/24 1539 01/24/24 2230 01/25/24 0257 01/26/24 0315 01/27/24  0330  NA 131* 133* 134* 137 138  K 2.9* 3.2* 3.0* 3.5 3.4*  CL 96* 101 105 106 101  CO2 19* 18* 18* 18* 21*  GLUCOSE 111* 106* 118* 114* 106*  BUN 21* 17 16 9 8   CREATININE 1.12 0.97 0.89 0.76 0.77  CALCIUM  8.1* 7.8* 7.7* 7.8* 8.3*  MG  --  1.7  --  2.1 2.1  PHOS  --   --   --  2.0* 2.8   GFR: CrCl cannot be  calculated (Unknown ideal weight.). Recent Labs  Lab 01/24/24 1539 01/24/24 2230 01/25/24 0257 01/26/24 0315 01/27/24 0330  PROCALCITON  --  0.37  --   --   --   WBC 3.4*  --  4.1 8.4 12.2*    Liver Function Tests: Recent Labs  Lab 01/24/24 1539 01/25/24 0257 01/26/24 0315 01/27/24 0330  AST 124* 126* 106* 70*  ALT 42 37 32 31  ALKPHOS 70 59 64 70  BILITOT 0.8 0.6 0.5 0.9  PROT 6.6 5.4* 5.1* 5.6*  ALBUMIN 3.1* 2.5* 2.3* 2.3*   No results for input(s): "LIPASE", "AMYLASE" in the last 168 hours. No results for input(s): "AMMONIA" in the last 168 hours.  ABG    Component Value Date/Time   HCO3 20.4 01/25/2024 0257   ACIDBASEDEF 2.7 (H) 01/25/2024 0257   O2SAT 96.8 01/25/2024 0257     Coagulation Profile: No results for input(s): "INR", "PROTIME" in the last 168 hours.  Cardiac Enzymes: No results for input(s): "CKTOTAL", "CKMB", "CKMBINDEX", "TROPONINI" in the last 168 hours.  HbA1C: No results found for: "HGBA1C"  CBG: No results for input(s): "GLUCAP" in the last 168 hours.  Review of Systems:   Please see the history of present illness. All other systems reviewed and are negative   Past Medical History:  He,  has a past medical history of Excessive drinking alcohol, Tinnitus, and Tobacco use.   Surgical History:   Past Surgical History:  Procedure Laterality Date   FRACTURE SURGERY     LAPAROSCOPIC APPENDECTOMY N/A 01/21/2018   Procedure: APPENDECTOMY LAPAROSCOPIC;  Surgeon: Sim Dryer, MD;  Location: [facility] OR;  Service: General;  Laterality: N/A;     Social History:   reports that he has been smoking cigars. He has never used smokeless tobacco. He reports current alcohol use of about 35.0 standard drinks of alcohol per week. He reports that he does not use drugs.   Family History:  His family history is not on file.   Allergies No Known Allergies   Home Medications  Prior to Admission medications   Not on File     Critical care time: NA   Raylen Ken D. Harris, NP-C [facility] Personal contact information can be found on Amion  If no contact or response made please call [phone] 01/27/2024, 7:08 PM

## 2024-01-27 NOTE — Progress Notes (Signed)
  Progress Note   Patient: Xavier [name] EAV:[id number] DOB: Feb 27, 1966 DOA: 01/24/2024     3 DOS: the patient was seen and examined on 01/27/2024   Assessment and Plan: Acute hypoxic respiratory failure secondary to community-acquired pneumonia and influenza Multifocal pneumonia SIRS - Continue hi-flow and BIPAP PRN - Tamiflu  75 mg PO bid  - IV zosyn  3.375 g q8hr  - PO doxycyline 100 mg q12  - Robitussin 10 mL PO q6hr  - Atrovent  q6 hr  - Xopenex  q6 hr  - Duoneb q4 hr PRN    Acute alcohol withdrawal - Folic acid  1 mg PO daily  - MVI PO daily  - Thiamine  100 mg daily  - Gabapentin  300 mg PO tid  - Ativan  PRN w/ CIWA   Paroxysmal atrial fibrillation-secondary to dehydration, alcohol withdrawal and in the setting of hypoxia - Appears resolved    Hyponatremia & Hypokalemia - Na+ wnl - K+ 40 meq PO x 2    Prerenal acute kidney injury - Resolved    Elevated troponin secondary to demand ischemia -Elevated troponin secondary to demand ischemia in the setting of acute hypoxic respiratory failure - ECHO wnl LVEF 55-60% and no WMA's   Thrombocytopenia - Resolved   Left-sided leg pain - Patient is complaining about left leg pain.  Physical exam unremarkable for any unequal swelling.  Bilateral lower extremities peripheral pedal pulse are 2+ and intact.   - DVT U/S is negative for DVT - Oxycodone  5 mg PO q6 hr PRN (moderate pain) - IV morphine  2 mg q4 hr (severe pain) - Gabapentin  as above    Transaminitis - Monitor -->improving    Chronic smoking cigarette - Nicotine  patch ordered   Diarrhea - GI panel and c.diff ordered both negative  - Lactobacillus 1 g PO tid    Subjective: Pt seen and examined at the bedside. WBC is increasing and CRP also rising. IV ceftriaxone  changed to IV zosyn  today. MRSA nasal swab also ordered.  Physical Exam: Vitals:   01/27/24 0439 01/27/24 0818 01/27/24 0823 01/27/24 0933  BP: 126/83  126/69   Pulse: 88 87 83 80  Resp: 20 (!) 22 19  (!) 28  Temp: 98.9 F (37.2 C)  98.7 F (37.1 C)   TempSrc: Axillary  Oral   SpO2: 93% 90% 91% 93%  Weight:       HENT:     Head: Normocephalic.  Cardiovascular:     Rate and Rhythm: Normal rate and regular rhythm.  Pulmonary:     Effort: Intermittent tachypnea  Abdominal:     Palpations: Abdomen is soft.  Skin:    General: Skin is warm.  Neurological:     Mental Status: He is alert and oriented to person, place, and time.  Psychiatric:        Mood and Affect: Mood normal.      Disposition: Status is: Inpatient Remains inpatient appropriate because: IV antibx and oxygen support   Planned Discharge Destination: Home    Time spent: 35 minutes  Author: Moishe Schellenberg , MD 01/27/2024 11:19 AM  For on call review [email or web address].

## 2024-01-27 NOTE — Progress Notes (Signed)
   01/27/24 2339  BiPAP/CPAP/SIPAP  $ Non-Invasive Ventilator  Non-Invasive Vent Subsequent  BiPAP/CPAP/SIPAP Pt Type Adult  BiPAP/CPAP/SIPAP SERVO  Mask Type Full face mask  Dentures removed? Not applicable  Mask Size Medium  Set Rate 15 breaths/min  Respiratory Rate 40 breaths/min  Pressure Support 12 cmH20  PEEP 8 cmH20  FiO2 (%) (S)  90 %  Minute Ventilation 21.7  Leak 50  Peak Inspiratory Pressure (PIP) 11  Tidal Volume (Vt) 560  Patient Home Equipment No  Auto Titrate No  Press High Alarm 30 cmH2O  Press Low Alarm 2 cmH2O  CPAP/SIPAP surface wiped down Yes  BiPAP/CPAP /SiPAP Vitals  Pulse Rate (!) 104  Resp (!) 23  SpO2 96 %  Bilateral Breath Sounds Diminished  MEWS Score/Color  MEWS Score 2  MEWS Score Color Yellow

## 2024-01-27 NOTE — Progress Notes (Signed)
 [place] [address] [place] while patient was on bipap. Patient remained stable during transport.

## 2024-01-27 NOTE — Progress Notes (Signed)
 Pt placed on HHFNC at this time per pt request to trial off BiPAP. Pt sats stayed in low 80's to 70's for several minutes. RT increased FIO2 and flow and pt seemed to tolerate HHFNC better. RN aware and pt informed if he starts to feel SOB to let RN/RT know and will be placed back on BiPAP

## 2024-01-27 NOTE — Significant Event (Signed)
 Rapid Response Event Note   Reason for Call :  Hypoxia. Pt placed on HHFNC to eat and became SOB after about an hour with HR up to 130s and spO2 dropping to 70s per RN  Initial Focused Assessment:  On arrival, pt resting in bed on Bipap. Lungs diminished with some crackles to left side. Complaints of SOB but improving since Bipap replaced.   VS: HR 105, BP 111/76, RR 33, spO2 93% on Bipap 80%  Interventions:  CXR  Plan of Care:  Continue to monitor pt respiratory status. RN to call with any changes or concerns.    Event Summary:   Arrival Time: 1705 End Time: 1714  Pat Bonier, RN

## 2024-01-27 NOTE — Progress Notes (Signed)
 eLink Physician-Brief Progress Note Patient Name: Xavier [name] DOB: 06/17/1966 MRN: [id number]   Date of Service  01/27/2024  HPI/Events of Note  1M with EtOH abuse admitted for influenza pna. Transferred to ICU for BiPAP.   Tele afib with 110-120s on dilt gtt 5 mg/h  Patient appears comfortable and responsive on BiPAP  eICU Interventions  Remain on BiPAP. Monitor for respiratory distress     Intervention Category Evaluation Type: New Patient Evaluation  [name] Genetta Kenning 01/27/2024, 8:26 PM

## 2024-01-28 ENCOUNTER — Inpatient Hospital Stay (HOSPITAL_COMMUNITY): Payer: 59

## 2024-01-28 DIAGNOSIS — I4891 Unspecified atrial fibrillation: Secondary | ICD-10-CM | POA: Diagnosis not present

## 2024-01-28 DIAGNOSIS — J9601 Acute respiratory failure with hypoxia: Secondary | ICD-10-CM | POA: Diagnosis not present

## 2024-01-28 DIAGNOSIS — J09X1 Influenza due to identified novel influenza A virus with pneumonia: Secondary | ICD-10-CM | POA: Diagnosis not present

## 2024-01-28 LAB — CBC
HCT: 37.2 % — ABNORMAL LOW (ref 39.0–52.0)
Hemoglobin: 13.2 g/dL (ref 13.0–17.0)
MCH: 35.7 pg — ABNORMAL HIGH (ref 26.0–34.0)
MCHC: 35.5 g/dL (ref 30.0–36.0)
MCV: 100.5 fL — ABNORMAL HIGH (ref 80.0–100.0)
Platelets: 172 10*3/uL (ref 150–400)
RBC: 3.7 MIL/uL — ABNORMAL LOW (ref 4.22–5.81)
RDW: 12.7 % (ref 11.5–15.5)
WBC: 9.5 10*3/uL (ref 4.0–10.5)
nRBC: 0 % (ref 0.0–0.2)

## 2024-01-28 LAB — COMPREHENSIVE METABOLIC PANEL
ALT: 34 U/L (ref 0–44)
AST: 50 U/L — ABNORMAL HIGH (ref 15–41)
Albumin: 2.2 g/dL — ABNORMAL LOW (ref 3.5–5.0)
Alkaline Phosphatase: 71 U/L (ref 38–126)
Anion gap: 15 (ref 5–15)
BUN: 13 mg/dL (ref 6–20)
CO2: 25 mmol/L (ref 22–32)
Calcium: 8.5 mg/dL — ABNORMAL LOW (ref 8.9–10.3)
Chloride: 99 mmol/L (ref 98–111)
Creatinine, Ser: 0.76 mg/dL (ref 0.61–1.24)
GFR, Estimated: >60 mL/min (ref >60)
Glucose, Bld: 165 mg/dL — ABNORMAL HIGH (ref 70–99)
Potassium: 4 mmol/L (ref 3.5–5.1)
Sodium: 139 mmol/L (ref 135–145)
Total Bilirubin: 1 mg/dL (ref 0.0–1.2)
Total Protein: 5.8 g/dL — ABNORMAL LOW (ref 6.5–8.1)

## 2024-01-28 LAB — PHOSPHORUS: Phosphorus: 3.6 mg/dL (ref 2.5–4.6)

## 2024-01-28 LAB — MAGNESIUM: Magnesium: 2.3 mg/dL (ref 1.7–2.4)

## 2024-01-28 LAB — GLUCOSE, CAPILLARY
Glucose-Capillary: 160 mg/dL — ABNORMAL HIGH (ref 70–99)
Glucose-Capillary: 147 mg/dL — ABNORMAL HIGH (ref 70–99)
Glucose-Capillary: 134 mg/dL — ABNORMAL HIGH (ref 70–99)

## 2024-01-28 LAB — C-REACTIVE PROTEIN: CRP: 20.5 mg/dL — ABNORMAL HIGH (ref <1.0)

## 2024-01-28 NOTE — Progress Notes (Signed)
NAME:  Xavier [name], MRN:  [id number], DOB:  June 20, 1966, LOS: 4 ADMISSION DATE:  01/24/2024, CONSULTATION DATE:  01/27/2024 REFERRING MD:  D. Sira - TR, CHIEF COMPLAINT: Hypoxic respiratory failure secondary to influenza A  History of Present Illness:  Xavier [name] is a 58 year old male with a past medical history significant for tobacco and alcohol use.  Who presented to the ED 2/7 for complaints of shortness of breath and leg pain.  Patient was seen hypoxic with oxygen saturations 80% on 5 L nasal cannula on presentation.  Workup revealed patient was flu a positive.  Since admission patient has remained BiPAP dependent with short trials of heated high flow nasal cannula that failed quickly.  PCCM consulted for additional assistance in management 2/10.   Significant Hospital Events: Including procedures, antibiotic start and stop dates in addition to other pertinent events   2/7 admitted for shortness of breath and leg pain found to be influenza A positive.  Interim History / Subjective:  Feeling better this morning - wore bipap overnight, now on HFNC to eat breakfast sats 92%.   Objective   Blood pressure 121/76, pulse 98, temperature 97.9 F (36.6 C), temperature source Axillary, resp. rate 19, weight 80.7 kg, SpO2 90%.    Vent Mode: PCV;BIPAP FiO2 (%):  [60 %-100 %] 100 % Set Rate:  [15 bmp] 15 bmp PEEP:  [8 cmH20] 8 cmH20 Pressure Support:  [12 cmH20] 12 cmH20   Intake/Output Summary (Last 24 hours) at 01/28/2024 4098 Last data filed at 01/28/2024 0800 Gross per 24 hour  Intake 733.2 ml  Output 1730 ml  Net -996.8 ml   Filed Weights   01/26/24 1756  Weight: 80.7 kg    Examination: General: Ill but nontoxic appearing male NAD on HHFNC HEENT: [place]/AT, MM pink/moist, PERRL,  Neuro: awake, alert, oriented, MAE CV: s1s2 regular rate and rhythm, no murmur, rubs, or gallops,  PULM: resps even non labored on HHFNC, sats 92%, sats dipped quickly when [place] removed to blow his nose,  diminished throughout  GI: soft, bowel sounds active in all 4 quadrants, non-tender, non-distended, tolerating oral diet Extremities: warm/dry, no edema  Skin: no rashes or lesions   Resolved Hospital Problem list     Assessment & Plan:  Acute hypoxic respiratory failure secondary to community-acquired pneumonia and influenza A Multifocal pneumonia Tobacco use P: Continue to monitor in ICU  Continue HHFNC with BiPAP PRN for goals SpO2>92%  Continue IV Solu-Medrol Aspiration precautions As needed bronchodilators Tamiflu Continue zosyn, doxycycline  HR control as below   Atrial fibrillation with rapid ventricular response -Developed while trying to trial off BiPAP P: Cardizem drip Continuous telemetry Optimize electrolytes Ventilation support as above  Hypokalemia P: Supplement as needed Trend bemet  Left-sided leg pain -Doppler negative P: As needed opioids for pain control  Daily alcohol use with transaminitis P: CIWA protocol Monitor for withdrawals Avoid hepatotoxins   Best Practice (right click and "Reselect all SmartList Selections" daily)   Diet/type: Regular consistency (see orders) DVT prophylaxis LMWH Pressure ulcer(s): N/A GI prophylaxis: PPI Lines: N/A Foley:  N/A Code Status:  full code Last date of multidisciplinary goals of care discussion: Pending   Labs   CBC: Recent Labs  Lab 01/24/24 1539 01/25/24 0257 01/26/24 0315 01/27/24 0330 01/28/24 0358  WBC 3.4* 4.1 8.4 12.2* 9.5  NEUTROABS 2.9  --   --   --   --   HGB 16.2 14.5 13.3 13.3 13.2  HCT 44.5 39.6 37.4* 37.3*  37.2*  MCV 96.9 97.3 100.5* 99.5 100.5*  PLT 124* 107* 136* 156 172    Basic Metabolic Panel: Recent Labs  Lab 01/24/24 2230 01/25/24 0257 01/26/24 0315 01/27/24 0330 01/27/24 1912 01/28/24 0358  NA 133* 134* 137 138 137 139  K 3.2* 3.0* 3.5 3.4* 3.6 4.0  CL 101 105 106 101 101 99  CO2 18* 18* 18* 21* 24 25  GLUCOSE 106* 118* 114* 106* 124* 165*  BUN 17  16 9 8 10 13   CREATININE 0.97 0.89 0.76 0.77 0.70 0.76  CALCIUM 7.8* 7.7* 7.8* 8.3* 8.2* 8.5*  MG 1.7  --  2.1 2.1 2.1 2.3  PHOS  --   --  2.0* 2.8  --  3.6   GFR: CrCl cannot be calculated (Unknown ideal weight.). Recent Labs  Lab 01/24/24 2230 01/25/24 0257 01/26/24 0315 01/27/24 0330 01/28/24 0358  PROCALCITON 0.37  --   --   --   --   WBC  --  4.1 8.4 12.2* 9.5    Liver Function Tests: Recent Labs  Lab 01/24/24 1539 01/25/24 0257 01/26/24 0315 01/27/24 0330 01/28/24 0358  AST 124* 126* 106* 70* 50*  ALT 42 37 32 31 34  ALKPHOS 70 59 64 70 71  BILITOT 0.8 0.6 0.5 0.9 1.0  PROT 6.6 5.4* 5.1* 5.6* 5.8*  ALBUMIN 3.1* 2.5* 2.3* 2.3* 2.2*   No results for input(s): "LIPASE", "AMYLASE" in the last 168 hours. No results for input(s): "AMMONIA" in the last 168 hours.  ABG    Component Value Date/Time   HCO3 20.4 01/25/2024 0257   ACIDBASEDEF 2.7 (H) 01/25/2024 0257   O2SAT 96.8 01/25/2024 0257     Coagulation Profile: No results for input(s): "INR", "PROTIME" in the last 168 hours.  Cardiac Enzymes: No results for input(s): "CKTOTAL", "CKMB", "CKMBINDEX", "TROPONINI" in the last 168 hours.  HbA1C: No results found for: "HGBA1C"  CBG: Recent Labs  Lab 01/27/24 1942 01/27/24 2351 01/28/24 0327 01/28/24 0745  GLUCAP 117* 138* 160* 147*      Dirk Dress, NP Pulmonary/Critical Care Medicine  01/28/2024  9:39 AM   See Amion for personal pager PCCM on call pager [phone] until 7pm. Please call Elink 7p-7a. [phone]

## 2024-01-28 NOTE — TOC CM/SW Note (Addendum)
Transition of Care [facility]) - Inpatient Brief Assessment   Patient Details  Name: Xavier [name] MRN: [id number] Date of Birth: June 16, 1966  Transition of Care [facility]) CM/SW Contact:    Tom-Johnson, Hershal Coria, RN Phone Number: 01/28/2024, 2:11 PM   Clinical Narrative:  Patient presented to the ED with Dyspnea, Productive Cough, Fever and Body Aches.  Admitted with Acute Hypoxic Respiratory Failure 2/2 Flu Infection/CAP. Currently on BIPAP at bedtime and 40L HHFNC during the daytime. On IV abx. Patient has hx of ETOH Abuse, CM unable to assess and educate at this time.   CM will continue to follow a patient progresses with care towards discharge.               Transition of Care Asessment: Insurance and Status: Insurance coverage has been reviewed Patient has primary care physician: Yes Home environment has been reviewed: Yes Prior level of function:: Independent Prior/Current Home Services: No current home services Social Drivers of Health Review: SDOH reviewed no interventions necessary Readmission risk has been reviewed: Yes Transition of care needs: transition of care needs identified, TOC will continue to follow

## 2024-01-28 NOTE — Progress Notes (Incomplete)
NAME:  Xavier [name], MRN:  [id number], DOB:  11/10/1966, LOS: 4 ADMISSION DATE:  01/24/2024, CONSULTATION DATE:  01/27/2024 REFERRING MD:  D. Sira - TR, CHIEF COMPLAINT: Hypoxic respiratory failure secondary to influenza A  History of Present Illness:  Xavier [name] is a 58 year old male with a past medical history significant for tobacco and alcohol use.  Who presented to the ED 2/7 for complaints of shortness of breath and leg pain.  Patient was seen hypoxic with oxygen saturations 80% on 5 L nasal cannula on presentation.  Workup revealed patient was flu a positive.  Since admission patient has remained BiPAP dependent with short trials of heated high flow nasal cannula that failed quickly.  PCCM consulted for additional assistance in management 2/10.  Pertinent  Medical History  Recommend alcohol use  Significant Hospital Events: Including procedures, antibiotic start and stop dates in addition to other pertinent events   2/7 admitted for shortness of breath and leg pain found to be influenza A positive.  Interim History / Subjective:   Off Bipap, transitioned to HFNC  Objective   Blood pressure 121/76, pulse 98, temperature 97.9 F (36.6 C), temperature source Axillary, resp. rate 19, weight 80.7 kg, SpO2 90%.    Vent Mode: PCV;BIPAP FiO2 (%):  [60 %-100 %] 100 % Set Rate:  [15 bmp] 15 bmp PEEP:  [8 cmH20] 8 cmH20 Pressure Support:  [12 cmH20] 12 cmH20   Intake/Output Summary (Last 24 hours) at 01/28/2024 0855 Last data filed at 01/28/2024 0800 Gross per 24 hour  Intake 733.2 ml  Output 1730 ml  Net -996.8 ml   Filed Weights   01/26/24 1756  Weight: 80.7 kg    Examination: Blood pressure 121/76, pulse 98, temperature 97.9 F (36.6 C), temperature source Axillary, resp. rate 19, weight 80.7 kg, SpO2 90%. Gen:      No acute distress HEENT:  EOMI, sclera anicteric Neck:     No masses; no thyromegaly Lungs:    Clear to auscultation bilaterally; normal respiratory  effort*** CV:         Regular rate and rhythm; no murmurs Abd:      + bowel sounds; soft, non-tender; no palpable masses, no distension Ext:    No edema; adequate peripheral perfusion Skin:      Warm and dry; no rash Neuro: alert and oriented x 3 Psych: normal mood and affect      General: Acute on chronic ill appearing middle aged male lying in bed in mild respiratory distress on BiPAP HEENT: [place]/AT, BiPAP mask in place, MM pink/moist, PERRL,  Neuro: Alert and oriented x 3, nonfocal CV: s1s2 regular rate and rhythm, no murmur, rubs, or gallops,  PULM: Tachypnea, diminished bilaterally, tolerating BiPAP, appears tired GI: soft, bowel sounds active in all 4 quadrants, non-tender, non-distended, tolerating oral diet Extremities: warm/dry, no edema  Skin: no rashes or lesions   Resolved Hospital Problem list     Assessment & Plan:  Acute hypoxic respiratory failure secondary to community-acquired pneumonia and influenza A Multifocal pneumonia Tobacco use P: Transfer to ICU for close monitoring of airway protection Threshold to intubate Continue BiPAP Start IV Solu-Medrol Aspiration precautions Low threshold to intubate As needed bronchodilators Tamiflu IV Zosyn  Atrial fibrillation with rapid ventricular response -Developed while trying to trial off BiPAP P: Cardizem drip Continuous telemetry Optimize electrolytes Ventilation support as above  Hypokalemia P: Supplement as needed Trend bemet  Left-sided leg pain -Doppler negative P: As needed opioids for pain control  Daily alcohol use with transaminitis P: CIWA protocol Monitor for withdrawals Avoid hepatotoxins   Best Practice (right click and "Reselect all SmartList Selections" daily)   Diet/type: NPO DVT prophylaxis LMWH Pressure ulcer(s): N/A GI prophylaxis: PPI Lines: N/A Foley:  N/A Code Status:  full code Last date of multidisciplinary goals of care discussion: Pending   Critical care time:    The patient is critically ill with multiple organ system failure and requires high complexity decision making for assessment and support, frequent evaluation and titration of therapies, advanced monitoring, review of radiographic studies and interpretation of complex data.   Critical Care Time devoted to patient care services, exclusive of separately billable procedures, described in this note is *** minutes.   Chilton Greathouse MD [facility] See Amion for pager  If no response to pager , please call [phone] until 7pm After 7:00 pm call Elink  [phone] 01/28/2024, 8:56 AM

## 2024-01-28 NOTE — Progress Notes (Signed)
  Progress Note   Patient: Xavier [name] ZOX:[id number] DOB: 1966/08/24 DOA: 01/24/2024     4 DOS: the patient was seen and examined on 01/28/2024    Assessment and Plan: Acute hypoxic respiratory failure secondary to community-acquired pneumonia and influenza Multifocal pneumonia SIRS - Continue hi-flow and BIPAP PRN - Tamiflu 75 mg PO bid  - IV zosyn 3.375 g q8hr  - Pulmicort/brovana bid  - IV doxycyline 100 mg q12  - IV solumedrol 40 mg daily  - Yupelri 175 mcg neb daily  - Robitussin 10 mL PO q6hr  - Duoneb q4 hr PRN    Acute alcohol withdrawal - Folic acid 1 mg PO daily  - MVI PO daily  - Thiamine 100 mg daily  - Gabapentin 300 mg PO tid  - Ativan PRN w/ CIWA   Paroxysmal atrial fibrillation-secondary to dehydration, alcohol withdrawal and in the setting of hypoxia - Appears resolved    Hyponatremia & Hypokalemia - Resolved    Prerenal acute kidney injury - Resolved    Elevated troponin secondary to demand ischemia -Elevated troponin secondary to demand ischemia in the setting of acute hypoxic respiratory failure - ECHO wnl LVEF 55-60% and no WMA's   Thrombocytopenia - Resolved   Left-sided leg pain - Patient is complaining about left leg pain.  Physical exam unremarkable for any unequal swelling.  Bilateral lower extremities peripheral pedal pulse are 2+ and intact.   - DVT U/S is negative for DVT - Oxycodone 5 mg PO q6 hr PRN (moderate pain) - IV morphine 2 mg q4 hr (severe pain) - Gabapentin as above    Transaminitis - Monitor -->improving    Chronic smoking cigarette - Nicotine patch ordered   Diarrhea - GI panel and c.diff ordered both negative  - Lactobacillus 1 g PO tid   Subjective: Pt seen and examined at the bedside. Overnight pt moved to ICU for close monitor/air way protection. WBC downtrending 12 -->9.5. Appreciate pulmonary following along.  Physical Exam: Vitals:   01/28/24 0930 01/28/24 0945 01/28/24 1000 01/28/24 1046  BP:   107/63    Pulse: 77 80 77 80  Resp: (!) 24 (!) 24 (!) 28 19  Temp:      TempSrc:      SpO2: 94% (!) 88% 90% 94%  Weight:       HENT:     Head: Normocephalic.  Cardiovascular:     Rate and Rhythm: Normal rate and regular rhythm.  Pulmonary:     Effort: Intermittent tachypnea. Coarse breath sounds  Abdominal:     Palpations: Abdomen is soft.  Skin:    General: Skin is warm.  Neurological:     Mental Status: He is alert and oriented to person, place, and time.  Psychiatric:        Mood and Affect: Mood normal.      Disposition: Status is: Inpatient Remains inpatient appropriate because: oxygen support, IV antibx and breathing tx's  Planned Discharge Destination: Home    Time spent: 35 minutes  Author: Baron Hamper , MD 01/28/2024 10:49 AM  For on call review [email or web address].

## 2024-01-29 DIAGNOSIS — F1090 Alcohol use, unspecified, uncomplicated: Secondary | ICD-10-CM

## 2024-01-29 DIAGNOSIS — J9601 Acute respiratory failure with hypoxia: Secondary | ICD-10-CM | POA: Diagnosis not present

## 2024-01-29 DIAGNOSIS — J09X1 Influenza due to identified novel influenza A virus with pneumonia: Secondary | ICD-10-CM | POA: Diagnosis not present

## 2024-01-29 LAB — COMPREHENSIVE METABOLIC PANEL
ALT: 31 U/L (ref 0–44)
AST: 33 U/L (ref 15–41)
Albumin: 2.2 g/dL — ABNORMAL LOW (ref 3.5–5.0)
Alkaline Phosphatase: 62 U/L (ref 38–126)
Anion gap: 16 — ABNORMAL HIGH (ref 5–15)
BUN: 17 mg/dL (ref 6–20)
CO2: 25 mmol/L (ref 22–32)
Calcium: 8.6 mg/dL — ABNORMAL LOW (ref 8.9–10.3)
Chloride: 98 mmol/L (ref 98–111)
Creatinine, Ser: 0.61 mg/dL (ref 0.61–1.24)
GFR, Estimated: >60 mL/min (ref >60)
Glucose, Bld: 124 mg/dL — ABNORMAL HIGH (ref 70–99)
Potassium: 3.9 mmol/L (ref 3.5–5.1)
Sodium: 139 mmol/L (ref 135–145)
Total Bilirubin: 0.8 mg/dL (ref 0.0–1.2)
Total Protein: 6 g/dL — ABNORMAL LOW (ref 6.5–8.1)

## 2024-01-29 LAB — CBC
HCT: 34.3 % — ABNORMAL LOW (ref 39.0–52.0)
Hemoglobin: 12 g/dL — ABNORMAL LOW (ref 13.0–17.0)
MCH: 35.6 pg — ABNORMAL HIGH (ref 26.0–34.0)
MCHC: 35 g/dL (ref 30.0–36.0)
MCV: 101.8 fL — ABNORMAL HIGH (ref 80.0–100.0)
Platelets: 211 10*3/uL (ref 150–400)
RBC: 3.37 MIL/uL — ABNORMAL LOW (ref 4.22–5.81)
RDW: 12.8 % (ref 11.5–15.5)
WBC: 12.4 10*3/uL — ABNORMAL HIGH (ref 4.0–10.5)
nRBC: 0 % (ref 0.0–0.2)

## 2024-01-29 LAB — MAGNESIUM: Magnesium: 2.3 mg/dL (ref 1.7–2.4)

## 2024-01-29 LAB — PHOSPHORUS: Phosphorus: 3.3 mg/dL (ref 2.5–4.6)

## 2024-01-29 LAB — C-REACTIVE PROTEIN: CRP: 7.6 mg/dL — ABNORMAL HIGH (ref <1.0)

## 2024-01-29 MED ORDER — PIPERACILLIN-TAZOBACTAM 3.375 G IVPB
3.3750 g | Freq: Three times a day (TID) | INTRAVENOUS | Status: AC
  Administered 2024-01-29 – 2024-02-01 (×11): 3.375 g via INTRAVENOUS
  Filled 2024-01-29 (×11): qty 50

## 2024-01-29 NOTE — Progress Notes (Signed)
  Progress Note   Patient: Xavier [name] HKV:[id number] DOB: Jul 10, 1966 DOA: 01/24/2024     5 DOS: the patient was seen and examined on 01/29/2024    Assessment and Plan: Acute hypoxic respiratory failure secondary to community-acquired pneumonia and influenza Multifocal pneumonia SIRS - Continue hi-flow and BIPAP PRN - Tamiflu completed - IV zosyn 3.375 g q8hr  - Pulmicort/brovana bid  - IV solumedrol 40 mg daily  - Yupelri 175 mcg neb daily  - Duoneb q4 hr PRN    Acute alcohol withdrawal - Folic acid 1 mg PO daily  - MVI PO daily  - Thiamine 100 mg daily  - Gabapentin 300 mg PO tid  - Ativan PRN w/ CIWA   Paroxysmal atrial fibrillation-secondary to dehydration, alcohol withdrawal and in the setting of hypoxia - Appears resolved    Hyponatremia & Hypokalemia - Resolved    Prerenal acute kidney injury - Resolved    Elevated troponin secondary to demand ischemia -Elevated troponin secondary to demand ischemia in the setting of acute hypoxic respiratory failure - ECHO wnl LVEF 55-60% and no WMA's   Thrombocytopenia - Resolved   Left-sided leg pain - Patient is complaining about left leg pain.  Physical exam unremarkable for any unequal swelling.  Bilateral lower extremities peripheral pedal pulse are 2+ and intact.   - DVT U/S is negative for DVT - Oxycodone 5 mg PO q6 hr PRN (moderate pain) - IV morphine 2 mg q4 hr (severe pain) - Gabapentin as above    Transaminitis - Resolved    Diarrhea - GI panel and c.diff ordered both negative  - Lactobacillus 1 g PO tid   Subjective: Pt seen and examined at the bedside. His oxygen requirements are downtrending for the first time. He has been able to avoid BIPAP. Continue with current treatment.   Physical Exam: Vitals:   01/29/24 0749 01/29/24 0750 01/29/24 0752 01/29/24 0818  BP:      Pulse:   79   Resp:   17   Temp:    (!) 97.5 F (36.4 C)  TempSrc:    Axillary  SpO2: 91% 91% 92%   Weight:       HENT:      Head: Normocephalic.  Cardiovascular:     Rate and Rhythm: Normal rate and regular rhythm.  Pulmonary:     Effort: Improving aeration b/l Abdominal:     Palpations: Abdomen is soft.  Skin:    General: Skin is warm.  Neurological:     Mental Status: He is alert and oriented to person, place, and time.  Psychiatric:        Mood and Affect: Mood normal.     Disposition: Status is: Inpatient Remains inpatient appropriate because: Tapering oxygen requirements and IV antibx   Planned Discharge Destination: Home    Time spent: 35 minutes  Author: Baron Hamper , MD 01/29/2024 11:04 AM  For on call review [email or web address].

## 2024-01-29 NOTE — Progress Notes (Signed)
 NAME:  Xavier [name], MRN:  [id number], DOB:  11/10/1966, LOS: 5 ADMISSION DATE:  01/24/2024, CONSULTATION DATE:  01/27/2024 REFERRING MD:  D. Sira - TR, CHIEF COMPLAINT: Hypoxic respiratory failure secondary to influenza A  History of Present Illness:  Xavier [name] is a 58 year old male with a past medical history significant for tobacco and alcohol use.  Who presented to the ED 2/7 for complaints of shortness of breath and leg pain.  Patient was seen hypoxic with oxygen saturations 80% on 5 L nasal cannula on presentation.  Workup revealed patient was flu a positive.  Since admission patient has remained BiPAP dependent with short trials of heated high flow nasal cannula that failed quickly.  PCCM consulted for additional assistance in management 2/10.   Significant Hospital Events: Including procedures, antibiotic start and stop dates in addition to other pertinent events   2/7 admitted for shortness of breath and leg pain found to be influenza A positive.  Interim History / Subjective:  No acute events overnight, patient was able to walk to chair without dyspnea/dizziness/chest pain.  He overall feels better but continues to require HFNC with high oxygen requirements.   Objective   Blood pressure 124/78, pulse 79, temperature (!) 97.5 F (36.4 C), temperature source Axillary, resp. rate 17, weight 80.7 kg, SpO2 92%.    FiO2 (%):  [80 %-100 %] 80 %   Intake/Output Summary (Last 24 hours) at 01/29/2024 4098 Last data filed at 01/29/2024 0400 Gross per 24 hour  Intake 890.55 ml  Output 400 ml  Net 490.55 ml   Filed Weights   01/26/24 1756  Weight: 80.7 kg    Examination: General: Adult male resting in bed, no distress, on HFNC HEENT: /AT, MM pink/moist, PERRL,  Neuro: awake, alert, oriented, MAE CV: SR on telemetry, s1s2 regular rate and rhythm, no murmur, rubs, or gallops,  PULM: clear lung fields with diminished at bases, no increased work of breathing on exam.  GI: soft,  bowel sounds active in all 4 quadrants, non-tender, non-distended, tolerating oral diet Extremities: warm/dry, no edema  Skin: no rashes or lesions  Resolved Hospital Problem list     Assessment & Plan:  Acute hypoxic respiratory failure secondary to community-acquired pneumonia and influenza A Multifocal pneumonia Tobacco use P: Aformoterol/Budesonide/Yupelri, with PRN bronchodilators available Continue IV Solu-Medrol D 4 of 5 Tamiflu D  Continue zosyn D 3 of 5, doxycycline D 4 discontinue Has not required Bipap in 48 hours Continue HHFNC for goals SpO2>92% , currently 97% - will wean.   Atrial fibrillation with rapid ventricular response -Developed while trying to trial off BiPAP P: No further occurrences Continuous telemetry  Left-sided leg pain, improved -Doppler negative P: As needed opioids for pain control  Daily alcohol use with transaminitis P: CIWA protocol on phenobarb taper Has not required PRN lorazepam  Best Practice (right click and "Reselect all SmartList Selections" daily)   Diet/type: Regular consistency (see orders) DVT prophylaxis LMWH Pressure ulcer(s): N/A GI prophylaxis: PPI Lines: N/A Foley:  N/A Code Status:  full code Last date of multidisciplinary goals of care discussion: Patient updated at bedside on plan of care.    Labs   CBC: Recent Labs  Lab 01/24/24 1539 01/25/24 0257 01/26/24 0315 01/27/24 0330 01/28/24 0358 01/29/24 0413  WBC 3.4* 4.1 8.4 12.2* 9.5 12.4*  NEUTROABS 2.9  --   --   --   --   --   HGB 16.2 14.5 13.3 13.3 13.2 12.0*  HCT 44.5  39.6 37.4* 37.3* 37.2* 34.3*  MCV 96.9 97.3 100.5* 99.5 100.5* 101.8*  PLT 124* 107* 136* 156 172 211    Basic Metabolic Panel: Recent Labs  Lab 01/26/24 0315 01/27/24 0330 01/27/24 1912 01/28/24 0358 01/29/24 0413  NA 137 138 137 139 139  K 3.5 3.4* 3.6 4.0 3.9  CL 106 101 101 99 98  CO2 18* 21* 24 25 25   GLUCOSE 114* 106* 124* 165* 124*  BUN 9 8 10 13 17   CREATININE  0.76 0.77 0.70 0.76 0.61  CALCIUM 7.8* 8.3* 8.2* 8.5* 8.6*  MG 2.1 2.1 2.1 2.3 2.3  PHOS 2.0* 2.8  --  3.6 3.3   GFR: CrCl cannot be calculated (Unknown ideal weight.). Recent Labs  Lab 01/24/24 2230 01/25/24 0257 01/26/24 0315 01/27/24 0330 01/28/24 0358 01/29/24 0413  PROCALCITON 0.37  --   --   --   --   --   WBC  --    < > 8.4 12.2* 9.5 12.4*   < > = values in this interval not displayed.    Liver Function Tests: Recent Labs  Lab 01/25/24 0257 01/26/24 0315 01/27/24 0330 01/28/24 0358 01/29/24 0413  AST 126* 106* 70* 50* 33  ALT 37 32 31 34 31  ALKPHOS 59 64 70 71 62  BILITOT 0.6 0.5 0.9 1.0 0.8  PROT 5.4* 5.1* 5.6* 5.8* 6.0*  ALBUMIN 2.5* 2.3* 2.3* 2.2* 2.2*   No results for input(s): "LIPASE", "AMYLASE" in the last 168 hours. No results for input(s): "AMMONIA" in the last 168 hours.  ABG    Component Value Date/Time   HCO3 20.4 01/25/2024 0257   ACIDBASEDEF 2.7 (H) 01/25/2024 0257   O2SAT 96.8 01/25/2024 0257     Coagulation Profile: No results for input(s): "INR", "PROTIME" in the last 168 hours.  Cardiac Enzymes: No results for input(s): "CKTOTAL", "CKMB", "CKMBINDEX", "TROPONINI" in the last 168 hours.  HbA1C: No results found for: "HGBA1C"  CBG: Recent Labs  Lab 01/27/24 1942 01/27/24 2351 01/28/24 0327 01/28/24 0745 01/28/24 1130  GLUCAP 117* 138* 160* 147* 134*    CCT: 35 min  Earney Hamburg, ACNP Pulmonary/Critical Care Medicine  01/29/2024  8:33 AM   [facility] on call pager [phone] until 7pm. Please call Elink 7p-7a. [phone]

## 2024-01-30 DIAGNOSIS — J9601 Acute respiratory failure with hypoxia: Secondary | ICD-10-CM | POA: Diagnosis not present

## 2024-01-30 DIAGNOSIS — J09X1 Influenza due to identified novel influenza A virus with pneumonia: Secondary | ICD-10-CM | POA: Diagnosis not present

## 2024-01-30 LAB — CULTURE, BLOOD (ROUTINE X 2)
Culture: NO GROWTH
Culture: NO GROWTH

## 2024-01-30 LAB — C-REACTIVE PROTEIN: CRP: 2.6 mg/dL — ABNORMAL HIGH (ref <1.0)

## 2024-01-30 LAB — CBC
HCT: 33.2 % — ABNORMAL LOW (ref 39.0–52.0)
Hemoglobin: 11.5 g/dL — ABNORMAL LOW (ref 13.0–17.0)
MCH: 35.2 pg — ABNORMAL HIGH (ref 26.0–34.0)
MCHC: 34.6 g/dL (ref 30.0–36.0)
MCV: 101.5 fL — ABNORMAL HIGH (ref 80.0–100.0)
Platelets: 237 10*3/uL (ref 150–400)
RBC: 3.27 MIL/uL — ABNORMAL LOW (ref 4.22–5.81)
RDW: 12.5 % (ref 11.5–15.5)
WBC: 9 10*3/uL (ref 4.0–10.5)
nRBC: 0 % (ref 0.0–0.2)

## 2024-01-30 LAB — BASIC METABOLIC PANEL
Anion gap: 8 (ref 5–15)
BUN: 18 mg/dL (ref 6–20)
CO2: 26 mmol/L (ref 22–32)
Calcium: 8.1 mg/dL — ABNORMAL LOW (ref 8.9–10.3)
Chloride: 101 mmol/L (ref 98–111)
Creatinine, Ser: 0.68 mg/dL (ref 0.61–1.24)
GFR, Estimated: >60 mL/min (ref >60)
Glucose, Bld: 118 mg/dL — ABNORMAL HIGH (ref 70–99)
Potassium: 4 mmol/L (ref 3.5–5.1)
Sodium: 135 mmol/L (ref 135–145)

## 2024-01-30 MED ORDER — SODIUM CHLORIDE 0.9 % IV SOLN: INTRAVENOUS | Status: DC | PRN

## 2024-01-30 MED ORDER — METHYLPREDNISOLONE SODIUM SUCC 40 MG IJ SOLR
40.0000 mg | Freq: Every day | INTRAMUSCULAR | Status: AC
  Administered 2024-01-31: 40 mg via INTRAVENOUS
  Filled 2024-01-30: qty 1

## 2024-01-30 NOTE — Progress Notes (Signed)
NAME:  Xavier [name], MRN:  [id number], DOB:  April 13, 1966, LOS: 6 ADMISSION DATE:  01/24/2024, CONSULTATION DATE:  01/27/2024 REFERRING MD:  D. Sira - TR, CHIEF COMPLAINT: Hypoxic respiratory failure secondary to influenza A  History of Present Illness:  Xavier [name] is a 58 year old male with a past medical history significant for tobacco and alcohol use.  Who presented to the ED 2/7 for complaints of shortness of breath and leg pain.  Patient was seen hypoxic with oxygen saturations 80% on 5 L nasal cannula on presentation.  Workup revealed patient was flu a positive.  Since admission patient has remained BiPAP dependent with short trials of heated high flow nasal cannula that failed quickly.  PCCM consulted for additional assistance in management 2/10.  Significant Hospital Events: Including procedures, antibiotic start and stop dates in addition to other pertinent events   2/7 admitted for shortness of breath and leg pain found to be influenza A positive.  Interim History / Subjective:   No acute events overnight. Did not need bipap O2 needs are slowly improving  Objective   Blood pressure 110/65, pulse 70, temperature 97.9 F (36.6 C), temperature source Oral, resp. rate (!) 21, weight 80.7 kg, SpO2 (!) 89%.    FiO2 (%):  [60 %-90 %] 90 %   Intake/Output Summary (Last 24 hours) at 01/30/2024 0919 Last data filed at 01/30/2024 0800 Gross per 24 hour  Intake 119.28 ml  Output 950 ml  Net -830.72 ml   Filed Weights   01/26/24 1756  Weight: 80.7 kg    Examination: Blood pressure 110/65, pulse 70, temperature 97.9 F (36.6 C), temperature source Oral, resp. rate (!) 21, weight 80.7 kg, SpO2 (!) 89%. Gen:      No acute distress HEENT:  EOMI, sclera anicteric Neck:     No masses; no thyromegaly Lungs:    Clear to auscultation bilaterally; normal respiratory effort CV:         Regular rate and rhythm; no murmurs Abd:      + bowel sounds; soft, non-tender; no palpable masses, no  distension Ext:    No edema; adequate peripheral perfusion Skin:      Warm and dry; no rash Neuro: alert and oriented x 3 Psych: normal mood and affect   Labs are stable No new imaging  Resolved Hospital Problem list     Assessment & Plan:  Acute hypoxic respiratory failure secondary to community-acquired pneumonia and influenza A Multifocal pneumonia Tobacco use P: Aformoterol/Budesonide/Yupelri, with PRN bronchodilators available Continue IV Solu-Medrol D 3 of 5 Tamiflu D  Finish doxycycline.  Continue Zosyn for 5 days Has not required Bipap in 48 hours Continue HHFNC for goals SpO2>92% , currently 97% - will wean.   Atrial fibrillation with rapid ventricular response -Developed while trying to trial off BiPAP P: No further occurrences Continuous telemetry  Left-sided leg pain, improved -Doppler negative P: As needed opioids for pain control  Daily alcohol use with transaminitis P: CIWA protocol on phenobarb taper Has not required PRN lorazepam  PCCM will be available as needed.  Please call with any questions.  Best Practice (right click and "Reselect all SmartList Selections" daily)   Diet/type: Regular consistency (see orders) DVT prophylaxis LMWH Pressure ulcer(s): N/A GI prophylaxis: PPI Lines: N/A Foley:  N/A Code Status:  full code Last date of multidisciplinary goals of care discussion: Patient updated at bedside on plan of care.    Signature:   Chilton Greathouse MD [facility] See Amion for pager  If no response to pager , please call [phone] until 7pm After 7:00 pm call Elink  [phone] 01/30/2024, 9:20 AM

## 2024-01-30 NOTE — Progress Notes (Signed)
Progress Note   Patient: Xavier [name] ZOX:[id number] DOB: 1966-06-15 DOA: 01/24/2024     6 DOS: the patient was seen and examined on 01/30/2024   Brief hospital course: 58 yo M treated for acute hypoxic resp failure secondary to CAP and FLU A.  Pt has been requiring HFNC and alternating with BIPAP. His oxygen requirements were too much for the floor and as such he was moved to ICU for closer monitoring. In the ICU he has improved and his oxygen requirements are slowly coming down. He is still requiring the HFNC and are hopeful that he will be able to be transitioned to a regular [place] in the next 1-2 days.  Assessment and Plan: Acute hypoxic respiratory failure secondary to community-acquired pneumonia and influenza Multifocal pneumonia SIRS - Continue hi-flow and transition to regular [place] when appropriate  - Tamiflu completed - IV zosyn 3.375 g q8hr  - Pulmicort/brovana bid  - IV solumedrol 40 mg daily  - Yupelri 175 mcg neb daily  - Duoneb q4 hr PRN    Acute alcohol withdrawal - Folic acid 1 mg PO daily  - MVI PO daily  - Thiamine 100 mg daily  - Gabapentin 300 mg PO tid  - Ativan PRN w/ CIWA   Paroxysmal atrial fibrillation-secondary to dehydration, alcohol withdrawal and in the setting of hypoxia - Appears resolved    Hyponatremia & Hypokalemia - Resolved    Prerenal acute kidney injury - Resolved    Elevated troponin secondary to demand ischemia -Elevated troponin secondary to demand ischemia in the setting of acute hypoxic respiratory failure - ECHO wnl LVEF 55-60% and no WMA's   Thrombocytopenia - Resolved   Left-sided leg pain - Patient is complaining about left leg pain.  Physical exam unremarkable for any unequal swelling.  Bilateral lower extremities peripheral pedal pulse are 2+ and intact.   - DVT U/S is negative for DVT - Oxycodone 5 mg PO q6 hr PRN (moderate pain) - IV morphine 2 mg q4 hr (severe pain) - Gabapentin as above    Transaminitis - Resolved     Diarrhea - GI panel and c.diff ordered both negative  - Lactobacillus 1 g PO tid   Subjective: Pt seen and examined at the bedside. He is still on the HFNC but he maybe ready for a regular [place] in the next day or so. Continue IV antibx and all other resp treatments.  Physical Exam: Vitals:   01/30/24 0900 01/30/24 1000 01/30/24 1100 01/30/24 1115  BP: 112/69 128/68 121/70   Pulse: 63 64 62   Resp: 20 20 19    Temp:    97.9 F (36.6 C)  TempSrc:    Oral  SpO2: 96% 96% 97%   Weight:       HENT:     Head: Normocephalic.  Cardiovascular:     Rate and Rhythm: Normal rate and regular rhythm.  Pulmonary:     Effort: Improved aeration b/l Abdominal:     Palpations: Abdomen is soft.  Skin:    General: Skin is warm.  Neurological:     Mental Status: He is alert and oriented to person, place, and time.  Psychiatric:        Mood and Affect: Mood normal.      Disposition: Status is: Inpatient Remains inpatient appropriate because: IV antibx/steroids and oxygen supplementation   Planned Discharge Destination: Home    Time spent: 35 minutes  Author: Baron Hamper , MD 01/30/2024 2:09 PM  For on call  review [email or web address].

## 2024-01-31 ENCOUNTER — Encounter (HOSPITAL_COMMUNITY): Payer: Self-pay | Admitting: Internal Medicine

## 2024-01-31 DIAGNOSIS — J189 Pneumonia, unspecified organism: Secondary | ICD-10-CM | POA: Diagnosis not present

## 2024-01-31 LAB — COMPREHENSIVE METABOLIC PANEL
ALT: 44 U/L (ref 0–44)
AST: 27 U/L (ref 15–41)
Albumin: 2.2 g/dL — ABNORMAL LOW (ref 3.5–5.0)
Alkaline Phosphatase: 49 U/L (ref 38–126)
Anion gap: 11 (ref 5–15)
BUN: 16 mg/dL (ref 6–20)
CO2: 27 mmol/L (ref 22–32)
Calcium: 8 mg/dL — ABNORMAL LOW (ref 8.9–10.3)
Chloride: 97 mmol/L — ABNORMAL LOW (ref 98–111)
Creatinine, Ser: 0.68 mg/dL (ref 0.61–1.24)
GFR, Estimated: >60 mL/min (ref >60)
Glucose, Bld: 87 mg/dL (ref 70–99)
Potassium: 3.8 mmol/L (ref 3.5–5.1)
Sodium: 135 mmol/L (ref 135–145)
Total Bilirubin: 0.9 mg/dL (ref 0.0–1.2)
Total Protein: 5.5 g/dL — ABNORMAL LOW (ref 6.5–8.1)

## 2024-01-31 LAB — CBC
HCT: 35.3 % — ABNORMAL LOW (ref 39.0–52.0)
Hemoglobin: 12.2 g/dL — ABNORMAL LOW (ref 13.0–17.0)
MCH: 35.3 pg — ABNORMAL HIGH (ref 26.0–34.0)
MCHC: 34.6 g/dL (ref 30.0–36.0)
MCV: 102 fL — ABNORMAL HIGH (ref 80.0–100.0)
Platelets: 254 10*3/uL (ref 150–400)
RBC: 3.46 MIL/uL — ABNORMAL LOW (ref 4.22–5.81)
RDW: 12.3 % (ref 11.5–15.5)
WBC: 8.7 10*3/uL (ref 4.0–10.5)
nRBC: 0 % (ref 0.0–0.2)

## 2024-01-31 LAB — MAGNESIUM: Magnesium: 1.9 mg/dL (ref 1.7–2.4)

## 2024-01-31 LAB — C-REACTIVE PROTEIN: CRP: 3.7 mg/dL — ABNORMAL HIGH (ref <1.0)

## 2024-01-31 MED ORDER — FLUCONAZOLE 100 MG PO TABS
100.0000 mg | ORAL_TABLET | Freq: Every day | ORAL | Status: DC
  Administered 2024-01-31 – 2024-02-01 (×2): 100 mg via ORAL
  Filled 2024-01-31 (×2): qty 1

## 2024-01-31 MED ORDER — MICONAZOLE NITRATE 2 % EX CREA
TOPICAL_CREAM | Freq: Two times a day (BID) | CUTANEOUS | Status: DC
  Filled 2024-01-31: qty 28.4

## 2024-01-31 NOTE — Hospital Course (Signed)
Patient is a 58 years old male with past medical history of asthma, diabetes, CAD, hypertension, history of AICD placement, stroke, CHF, sleep apnea, chronic smoker and alcohol consumer to hospital with leg pain and shortness of breath and was noted to be hypoxic in the ED.  He was also noted to have A-fib with RVR and was hypotensive and tachypneic.  Respiratory panel showed influenza A.  Chest x-ray showed right upper lobe infiltrate.  CTA of the chest showed no PE but multifocal and confluent areas of groundglass opacities throughout both lungs.  Initially patient was admitted to the ICU with BiPAP and high flow nasal cannula.  Patient is still on high flow nasal cannula 25 L/min but has been transferred out of the ICU.   Acute hypoxic respiratory failure secondary to community-acquired pneumonia and influenza Multifocal pneumonia  Still on high flow nasal cannula 25 L/min.  Has completed Tamiflu.  Receiving IV Zosyn, continue Pulmicort Brovana twice daily.  Continue IV Solu-Medrol 40 daily, Yupelri 175 mcg neb daily  - Duoneb q4 hr PRN    Acute alcohol withdrawal Continue multivitamin thiamine folic acid gabapentin.  Continue CIWA protocol with Ativan.    Paroxysmal atrial fibrillation-secondary to dehydration, alcohol withdrawal and in the setting of hypoxia Normal sinus rhythm at this time.   Hyponatremia  Improved.  Latest sodium of 135.   Hypokalemia - Resolved.  Latest potassium of 3.8.    acute kidney injury Likely secondary to volume depletion.  Has improved.  Latest creatinine of 0.6.   Elevated troponin secondary to demand ischemia  in the setting of acute hypoxic respiratory failure.  2D echocardiogram done showed LV ejection fraction of 55 to 60% with no regional wall motion abnormality.  Thrombocytopenia - Resolved latest platelet count of 254.   Left-sided leg pain  DVT U/S is negative for DVT continue morphine oxycodone gabapentin.  Elevated LFTs - Resolved     Diarrhea GI pathogen panel and C. difficile was negative.  Continue probiotic.

## 2024-01-31 NOTE — Plan of Care (Signed)
  Problem: Education: Goal: Knowledge of General Education information will improve Description: Including pain rating scale, medication(s)/side effects and non-pharmacologic comfort measures Outcome: Progressing   Problem: Health Behavior/Discharge Planning: Goal: Ability to manage health-related needs will improve Outcome: Progressing   Problem: Clinical Measurements: Goal: Will remain free from infection Outcome: Progressing Goal: Diagnostic test results will improve Outcome: Progressing Goal: Respiratory complications will improve Outcome: Progressing Goal: Cardiovascular complication will be avoided Outcome: Progressing   Problem: Activity: Goal: Risk for activity intolerance will decrease Outcome: Progressing   Problem: Nutrition: Goal: Adequate nutrition will be maintained Outcome: Progressing   Problem: Coping: Goal: Level of anxiety will decrease Outcome: Progressing

## 2024-01-31 NOTE — Progress Notes (Signed)
PROGRESS NOTE  Xavier [name] BJY:[id number] DOB: 1966/10/06 DOA: 01/24/2024 PCP: Jac Canavan, PA-C   LOS: 7 days   Brief narrative:  Patient is a 58 years old male with past medical history of asthma, diabetes, CAD, hypertension, history of AICD placement, stroke, CHF, sleep apnea, chronic smoker and alcohol consumer to hospital with leg pain and shortness of breath and was noted to be hypoxic in the ED.  He was also noted to have A-fib with RVR and was hypotensive and tachypneic.  Respiratory panel showed influenza A.  Chest x-ray showed right upper lobe infiltrate.  CTA of the chest showed no PE but multifocal and confluent areas of groundglass opacities throughout both lungs.  Initially patient was admitted to the ICU with BiPAP and high flow nasal cannula.  Patient is still on high flow nasal cannula 25 L/min but has been transferred out of the ICU.    Assessment/Plan: Principal Problem:   CAP (community acquired pneumonia) Active Problems:   Acute hypoxic respiratory failure (HCC)   Influenza A H1N1 infection   Hyponatremia   Hypokalemia   AKI (acute kidney injury) (HCC)   Alcohol withdrawal (HCC)   Paroxysmal atrial fibrillation (HCC)   Elevated troponin   Left leg pain   Acute hypoxic respiratory failure secondary to community-acquired pneumonia and influenza Multifocal pneumonia  Still on high flow nasal cannula 25 L/min.  Has completed Tamiflu.  Receiving IV Zosyn, continue Pulmicort Brovana twice daily.  Continue IV Solu-Medrol 40 daily, Yupelri 175 mcg neb daily. Continue Duoneb q4 hr PRN    Acute alcohol withdrawal Continue multivitamin thiamine folic acid gabapentin.  Continue CIWA protocol with Ativan.    Paroxysmal atrial fibrillation-secondary to dehydration, alcohol withdrawal and in the setting of hypoxia. Normal sinus rhythm at this time.   Hyponatremia  Improved.  Latest sodium of 135.   Hypokalemia - Resolved.  Latest potassium of 3.8.    acute  kidney injury Likely secondary to volume depletion.  Has improved.  Latest creatinine of 0.6.   Elevated troponin secondary to demand ischemia  in the setting of acute hypoxic respiratory failure.  2D echocardiogram done showed LV ejection fraction of 55 to 60% with no regional wall motion abnormality.  Thrombocytopenia - Resolved latest platelet count of 254.   Left-sided leg pain  DVT U/S is negative for DVT continue morphine oxycodone gabapentin.  Elevated LFTs - Resolved    Diarrhea GI pathogen panel and C. difficile was negative.  Continue probiotic.  Mildly itchy erythematous rash on the back.  Looks like fungal infection.  Will add oral Diflucan and Mycolog cream.  States that he gets it intermittently.   DVT prophylaxis: enoxaparin (LOVENOX) injection 40 mg Start: 01/25/24 1000 SCDs Start: 01/24/24 1817 Place TED hose Start: 01/24/24 1817   Disposition: Likely home with home health in few days.  Status is: Inpatient Remains inpatient appropriate because: High flow nasal cannula 25 L/min, pending clinical improvement.    Code Status:     Code Status: Full Code  Family Communication: None at bedside  Consultants: Patient seen  Procedures: None  Anti-infectives:  Diflucan  Anti-infectives (From admission, onward)    Start     Dose/Rate Route Frequency Ordered Stop   01/31/24 1200  fluconazole (DIFLUCAN) tablet 100 mg        100 mg Oral Daily 01/31/24 1109     01/29/24 1400  piperacillin-tazobactam (ZOSYN) IVPB 3.375 g        3.375 g 12.5 mL/hr over  240 Minutes Intravenous Every 8 hours 01/29/24 0948 02/02/24 0559   01/27/24 1945  doxycycline (VIBRAMYCIN) 100 mg in sodium chloride 0.9 % 250 mL IVPB  Status:  Discontinued        100 mg 125 mL/hr over 120 Minutes Intravenous Every 12 hours 01/27/24 1856 01/29/24 0948   01/27/24 1000  piperacillin-tazobactam (ZOSYN) IVPB 3.375 g  Status:  Discontinued        3.375 g 12.5 mL/hr over 240 Minutes Intravenous  Every 8 hours 01/27/24 0929 01/29/24 0948   01/26/24 2200  doxycycline (VIBRA-TABS) tablet 100 mg  Status:  Discontinued        100 mg Oral Every 12 hours 01/26/24 1519 01/27/24 1856   01/25/24 1000  cefTRIAXone (ROCEPHIN) 2 g in sodium chloride 0.9 % 100 mL IVPB  Status:  Discontinued        2 g 200 mL/hr over 30 Minutes Intravenous Every 24 hours 01/24/24 1816 01/27/24 0929   01/25/24 1000  doxycycline (VIBRAMYCIN) 100 mg in sodium chloride 0.9 % 250 mL IVPB  Status:  Discontinued        100 mg 125 mL/hr over 120 Minutes Intravenous Every 12 hours 01/24/24 1821 01/26/24 1519   01/24/24 2200  oseltamivir (TAMIFLU) capsule 75 mg  Status:  Discontinued        75 mg Oral 2 times daily 01/24/24 1825 01/29/24 1104   01/24/24 1830  doxycycline (VIBRAMYCIN) 100 mg in sodium chloride 0.9 % 250 mL IVPB  Status:  Discontinued        100 mg 125 mL/hr over 120 Minutes Intravenous Every 12 hours 01/24/24 1816 01/24/24 1821   01/24/24 1800  cefTRIAXone (ROCEPHIN) 1 g in sodium chloride 0.9 % 100 mL IVPB        1 g 200 mL/hr over 30 Minutes Intravenous  Once 01/24/24 1753 01/24/24 2126   01/24/24 1800  azithromycin (ZITHROMAX) 500 mg in sodium chloride 0.9 % 250 mL IVPB        500 mg 250 mL/hr over 60 Minutes Intravenous  Once 01/24/24 1753 01/24/24 2227        Subjective: Today, patient was seen and examined at bedside.  Patient denies any dyspnea but some shortness of breath on exertion.  Denies any pain, nausea, vomiting, fever, chills or rigor.  Complains of a mild erythematous rash over the back area which comes intermittently.  Objective: Vitals:   01/31/24 1100 01/31/24 1121  BP: 121/71   Pulse: 65   Resp: (!) 24   Temp:  98.6 F (37 C)  SpO2: 97%     Intake/Output Summary (Last 24 hours) at 01/31/2024 1316 Last data filed at 01/31/2024 1200 Gross per 24 hour  Intake 166.63 ml  Output 1625 ml  Net -1458.37 ml   Filed Weights   01/26/24 1756  Weight: 80.7 kg   Body mass index  is 25.53 kg/m.   Physical Exam:  GENERAL: Patient is alert awake and oriented. Not in obvious distress.  High flow nasal cannula oxygen HENT: No scleral pallor or icterus. Pupils equally reactive to light. Oral mucosa is moist NECK: is supple, no gross swelling noted. CHEST: Diminished breath sounds bilaterally. CVS: S1 and S2 heard, no murmur. Regular rate and rhythm.  ABDOMEN: Soft, non-tender, bowel sounds are present. EXTREMITIES: No edema. CNS: Cranial nerves are intact. No focal motor deficits. SKIN: warm and dry, erythematous rash with prominent erythematous borders.  Data Review: I have personally reviewed the following laboratory data and studies,  CBC: Recent Labs  Lab 01/24/24 1539 01/25/24 0257 01/27/24 0330 01/28/24 0358 01/29/24 0413 01/30/24 0300 01/31/24 0410  WBC 3.4*   < > 12.2* 9.5 12.4* 9.0 8.7  NEUTROABS 2.9  --   --   --   --   --   --   HGB 16.2   < > 13.3 13.2 12.0* 11.5* 12.2*  HCT 44.5   < > 37.3* 37.2* 34.3* 33.2* 35.3*  MCV 96.9   < > 99.5 100.5* 101.8* 101.5* 102.0*  PLT 124*   < > 156 172 211 237 254   < > = values in this interval not displayed.   Basic Metabolic Panel: Recent Labs  Lab 01/26/24 0315 01/27/24 0330 01/27/24 1912 01/28/24 0358 01/29/24 0413 01/30/24 0300 01/31/24 0410  NA 137 138 137 139 139 135 135  K 3.5 3.4* 3.6 4.0 3.9 4.0 3.8  CL 106 101 101 99 98 101 97*  CO2 18* 21* 24 25 25 26 27   GLUCOSE 114* 106* 124* 165* 124* 118* 87  BUN 9 8 10 13 17 18 16   CREATININE 0.76 0.77 0.70 0.76 0.61 0.68 0.68  CALCIUM 7.8* 8.3* 8.2* 8.5* 8.6* 8.1* 8.0*  MG 2.1 2.1 2.1 2.3 2.3  --  1.9  PHOS 2.0* 2.8  --  3.6 3.3  --   --    Liver Function Tests: Recent Labs  Lab 01/26/24 0315 01/27/24 0330 01/28/24 0358 01/29/24 0413 01/31/24 0410  AST 106* 70* 50* 33 27  ALT 32 31 34 31 44  ALKPHOS 64 70 71 62 49  BILITOT 0.5 0.9 1.0 0.8 0.9  PROT 5.1* 5.6* 5.8* 6.0* 5.5*  ALBUMIN 2.3* 2.3* 2.2* 2.2* 2.2*   No results for  input(s): "LIPASE", "AMYLASE" in the last 168 hours. No results for input(s): "AMMONIA" in the last 168 hours. Cardiac Enzymes: No results for input(s): "CKTOTAL", "CKMB", "CKMBINDEX", "TROPONINI" in the last 168 hours. BNP (last 3 results) Recent Labs    01/24/24 1539  BNP 88.4    ProBNP (last 3 results) No results for input(s): "PROBNP" in the last 8760 hours.  CBG: Recent Labs  Lab 01/27/24 1942 01/27/24 2351 01/28/24 0327 01/28/24 0745 01/28/24 1130  GLUCAP 117* 138* 160* 147* 134*   Recent Results (from the past 240 hours)  Resp panel by RT-PCR (RSV, Flu A&B, Covid) Anterior Nasal Swab     Status: Abnormal   Collection Time: 01/24/24  2:16 PM   Specimen: Anterior Nasal Swab  Result Value Ref Range Status   SARS Coronavirus 2 by RT PCR NEGATIVE NEGATIVE Final   Influenza A by PCR POSITIVE (A) NEGATIVE Final   Influenza B by PCR NEGATIVE NEGATIVE Final    Comment: (NOTE) The Xpert Xpress SARS-CoV-2/FLU/RSV plus assay is intended as an aid in the diagnosis of influenza from Nasopharyngeal swab specimens and should not be used as a sole basis for treatment. Nasal washings and aspirates are unacceptable for Xpert Xpress SARS-CoV-2/FLU/RSV testing.  Fact Sheet for Patients: BloggerCourse.com  Fact Sheet for Healthcare Providers: SeriousBroker.it  This test is not yet approved or cleared by the [place] FDA and has been authorized for detection and/or diagnosis of SARS-CoV-2 by FDA under an Emergency Use Authorization (EUA). This EUA will remain in effect (meaning this test can be used) for the duration of the COVID-19 declaration under Section 564(b)(1) of the Act, 21 U.S.C. section 360bbb-3(b)(1), unless the authorization is terminated or revoked.     Resp Syncytial Virus by PCR  NEGATIVE NEGATIVE Final    Comment: (NOTE) Fact Sheet for Patients: BloggerCourse.com  Fact Sheet for  Healthcare Providers: SeriousBroker.it  This test is not yet approved or cleared by the [place] FDA and has been authorized for detection and/or diagnosis of SARS-CoV-2 by FDA under an Emergency Use Authorization (EUA). This EUA will remain in effect (meaning this test can be used) for the duration of the COVID-19 declaration under Section 564(b)(1) of the Act, 21 U.S.C. section 360bbb-3(b)(1), unless the authorization is terminated or revoked.  Performed at [facility], [address] [place], [place] [address]   Culture, blood (routine x 2) Call MD if unable to obtain prior to antibiotics being given     Status: None   Collection Time: 01/24/24 10:25 PM   Specimen: BLOOD RIGHT ARM  Result Value Ref Range Status   Specimen Description BLOOD RIGHT ARM  Final   Special Requests   Final    BOTTLES DRAWN AEROBIC AND ANAEROBIC Blood Culture results may not be optimal due to an inadequate volume of blood received in culture bottles   Culture   Final    NO GROWTH 5 DAYS Performed at [facility], [address] [place], [place] [address]    Report Status 01/30/2024 FINAL  Final  Culture, blood (routine x 2) Call MD if unable to obtain prior to antibiotics being given     Status: None   Collection Time: 01/24/24 10:29 PM   Specimen: BLOOD RIGHT HAND  Result Value Ref Range Status   Specimen Description BLOOD RIGHT HAND  Final   Special Requests   Final    BOTTLES DRAWN AEROBIC AND ANAEROBIC Blood Culture results may not be optimal due to an inadequate volume of blood received in culture bottles   Culture   Final    NO GROWTH 5 DAYS Performed at [facility], [address] [place], [place] [address]    Report Status 01/30/2024 FINAL  Final  Gastrointestinal Panel by PCR , Stool     Status: None   Collection Time: 01/25/24  9:41 AM   Specimen: Stool  Result Value Ref Range Status   Campylobacter species NOT DETECTED NOT DETECTED  Final   Plesimonas shigelloides NOT DETECTED NOT DETECTED Final   Salmonella species NOT DETECTED NOT DETECTED Final   Yersinia enterocolitica NOT DETECTED NOT DETECTED Final   Vibrio species NOT DETECTED NOT DETECTED Final   Vibrio cholerae NOT DETECTED NOT DETECTED Final   Enteroaggregative E coli (EAEC) NOT DETECTED NOT DETECTED Final   Enteropathogenic E coli (EPEC) NOT DETECTED NOT DETECTED Final   Enterotoxigenic E coli (ETEC) NOT DETECTED NOT DETECTED Final   Shiga like toxin producing E coli (STEC) NOT DETECTED NOT DETECTED Final   Shigella/Enteroinvasive E coli (EIEC) NOT DETECTED NOT DETECTED Final   Cryptosporidium NOT DETECTED NOT DETECTED Final   Cyclospora cayetanensis NOT DETECTED NOT DETECTED Final   Entamoeba histolytica NOT DETECTED NOT DETECTED Final   Giardia lamblia NOT DETECTED NOT DETECTED Final   Adenovirus F40/41 NOT DETECTED NOT DETECTED Final   Astrovirus NOT DETECTED NOT DETECTED Final   Norovirus GI/GII NOT DETECTED NOT DETECTED Final   Rotavirus A NOT DETECTED NOT DETECTED Final   Sapovirus (I, II, IV, and V) NOT DETECTED NOT DETECTED Final    Comment: Performed at [facility], [address] [place], [place] [address]  C Difficile Quick Screen w PCR reflex     Status: None  Collection Time: 01/25/24  9:42 AM   Specimen: STOOL  Result Value Ref Range Status   C Diff antigen NEGATIVE NEGATIVE Final   C Diff toxin NEGATIVE NEGATIVE Final   C Diff interpretation No C. difficile detected.  Final    Comment: Performed at [facility], [address], [place], [place] [address]  MRSA Next Gen by PCR, Nasal     Status: None   Collection Time: 01/27/24  9:31 AM   Specimen: Nasal Mucosa; Nasal Swab  Result Value Ref Range Status   MRSA by PCR Next Gen NOT DETECTED NOT DETECTED Final    Comment: (NOTE) The GeneXpert MRSA Assay (FDA approved for NASAL specimens only), is one component of a comprehensive MRSA colonization surveillance program.  It is not intended to diagnose MRSA infection nor to guide or monitor treatment for MRSA infections. Test performance is not FDA approved in patients less than 71 years old. Performed at [facility], [address] [place], [place] [address]      Studies: No results found.    Joycelyn Das, MD  [facility] 01/31/2024  If 7PM-7AM, please contact night-coverage

## 2024-02-01 DIAGNOSIS — J189 Pneumonia, unspecified organism: Secondary | ICD-10-CM | POA: Diagnosis not present

## 2024-02-01 DIAGNOSIS — J101 Influenza due to other identified influenza virus with other respiratory manifestations: Secondary | ICD-10-CM

## 2024-02-01 DIAGNOSIS — I480 Paroxysmal atrial fibrillation: Secondary | ICD-10-CM

## 2024-02-01 DIAGNOSIS — F10939 Alcohol use, unspecified with withdrawal, unspecified: Secondary | ICD-10-CM

## 2024-02-01 DIAGNOSIS — M79605 Pain in left leg: Secondary | ICD-10-CM | POA: Diagnosis not present

## 2024-02-01 DIAGNOSIS — E871 Hypo-osmolality and hyponatremia: Secondary | ICD-10-CM | POA: Diagnosis not present

## 2024-02-01 LAB — BASIC METABOLIC PANEL
Anion gap: 9 (ref 5–15)
BUN: 14 mg/dL (ref 6–20)
CO2: 29 mmol/L (ref 22–32)
Calcium: 8.1 mg/dL — ABNORMAL LOW (ref 8.9–10.3)
Chloride: 97 mmol/L — ABNORMAL LOW (ref 98–111)
Creatinine, Ser: 0.69 mg/dL (ref 0.61–1.24)
GFR, Estimated: >60 mL/min (ref >60)
Glucose, Bld: 90 mg/dL (ref 70–99)
Potassium: 4.2 mmol/L (ref 3.5–5.1)
Sodium: 135 mmol/L (ref 135–145)

## 2024-02-01 LAB — CBC
HCT: 36.1 % — ABNORMAL LOW (ref 39.0–52.0)
Hemoglobin: 12.4 g/dL — ABNORMAL LOW (ref 13.0–17.0)
MCH: 34.8 pg — ABNORMAL HIGH (ref 26.0–34.0)
MCHC: 34.3 g/dL (ref 30.0–36.0)
MCV: 101.4 fL — ABNORMAL HIGH (ref 80.0–100.0)
Platelets: 274 10*3/uL (ref 150–400)
RBC: 3.56 MIL/uL — ABNORMAL LOW (ref 4.22–5.81)
RDW: 12.2 % (ref 11.5–15.5)
WBC: 7.3 10*3/uL (ref 4.0–10.5)
nRBC: 0 % (ref 0.0–0.2)

## 2024-02-01 LAB — MAGNESIUM: Magnesium: 2.1 mg/dL (ref 1.7–2.4)

## 2024-02-01 MED ORDER — ORAL CARE MOUTH RINSE: 15.0000 mL | OROMUCOSAL | Status: DC | PRN

## 2024-02-01 MED ORDER — PHENOBARBITAL 32.4 MG PO TABS
32.4000 mg | ORAL_TABLET | Freq: Three times a day (TID) | ORAL | Status: AC
Start: 2024-02-01 — End: 2024-02-02
  Administered 2024-02-01 – 2024-02-02 (×3): 32.4 mg via ORAL
  Filled 2024-02-01 (×3): qty 1

## 2024-02-01 NOTE — Plan of Care (Signed)
  Problem: Clinical Measurements: Goal: Ability to maintain clinical measurements within normal limits will improve Outcome: Progressing Goal: Will remain free from infection Outcome: Progressing Goal: Diagnostic test results will improve Outcome: Progressing Goal: Respiratory complications will improve Outcome: Progressing Goal: Cardiovascular complication will be avoided Outcome: Progressing   Problem: Activity: Goal: Risk for activity intolerance will decrease Outcome: Progressing   Problem: Pain Managment: Goal: General experience of comfort will improve and/or be controlled Outcome: Progressing   Problem: Safety: Goal: Ability to remain free from injury will improve Outcome: Progressing   Problem: Skin Integrity: Goal: Risk for impaired skin integrity will decrease Outcome: Progressing

## 2024-02-01 NOTE — Progress Notes (Signed)
 PROGRESS NOTE  Xavier [name] UJW:[id number] DOB: [birth year]   PCP: Jac Canavan, PA-C  Patient is from: Home.  DOA: 01/24/2024 LOS: 8  Chief complaints Chief Complaint  Patient presents with   Leg Pain   Shortness of Breath     Brief Narrative / Interim history: 58 year old M with PMH of CVA, CHF, ACID, CAD, asthma, DM-2, OSA, HTN, tobacco use disorder and alcohol use disorder presented to ED with shortness of breath and leg pain, and admitted with working diagnosis of acute respiratory failure with hypoxia due to multifocal pneumonia, influenza A infection and A-fib with RVR.  CXR with right upper lobe infiltrate.  CT angio chest negative for PE but multifocal and confluent areas of groundglass opacity throughout both lungs.  Patient was initially admitted to ICU and required BiPAP.  Eventually, weaned to [facility] and transferred to hospitalist service.  Subjective: Seen and examined earlier this morning.  No major events overnight of this morning.  No complaints other than some cough.  Denies shortness of breath, chest pain, GI or UTI symptoms.  Reports smoking about 10 to 15 cigarettes a day and drinking about 5-6 beers a day.  Objective: Vitals:   02/01/24 0906 02/01/24 1000 02/01/24 1100 02/01/24 1135  BP: (!) 139/123 104/60 100/60   Pulse: 75 69 65   Resp: 18 16 17    Temp:    98.3 F (36.8 C)  TempSrc:    Oral  SpO2: (!) 88% 97% 97%   Weight:        Examination:  GENERAL: No apparent distress.  Nontoxic. HEENT: MMM.  Vision and hearing grossly intact.  NECK: Supple.  No apparent JVD.  RESP:  No IWOB.  Fair aeration bilaterally.  Some rhonchi bilaterally. CVS:  RRR. Heart sounds normal.  ABD/GI/GU: BS+. Abd soft, NTND.  MSK/EXT:  Moves extremities. No apparent deformity. No edema.  SKIN: no apparent skin lesion or wound NEURO: Awake, alert and oriented appropriately.  No apparent focal neuro deficit. PSYCH: Calm. Normal affect.   Procedures:   None  Microbiology summarized: 2/7-influenza A PCR positive 2/7-COVID-19, influenza B and RSV PCR nonreactive 2/7-blood cultures NGTD 2/8-GIP and C. difficile negative. 2/10-MRSA PCR nonreactive    Assessment and plan: Acute hypoxic respiratory failure due to due to multifocal community-acquired pneumonia and influenza A infection: Initially required BiPAP.  CT angio chest negative for PE but multifocal pneumonia in both lungs.  Microbiology as above. -Completed course for Tamiflu and steroid. -CTX 2/7-2/10.  Zosyn 2/12>> -Zithromax 2/7>> doxycycline 2/8-2/12. -Continue scheduled and as needed nebulizers. -Wean oxygen as able -Incentive spirometry, OOB/PT/OT -Will transfer out of unit to progressive care.  Acute alcohol withdrawal: Reports drinking about 6 beers a day.  Denies major withdrawal symptoms in the past.  She would be outside withdrawal window now. -Continue phenobarbital taper -Continue multivitamin, thiamine and folic acid -Encourage alcohol cessation   Paroxysmal A-fib with RVR: Short-lived.  Likely due to alcohol and respiratory failure.  Currently NSR without meds. -Encourage alcohol cessation  Elevated troponin: Likely demand ischemia in the setting of the above.  TTE without significant finding.  Skin rash: Fungal infection? -Continue miconazole cream -Discontinue Diflucan.   Tobacco use disorder: Reports smoking 10 to 15 cigarettes a day. -Encouraged cessation.   Left-sided leg pain: Venous Doppler negative for DVT. -Pain control   Elevated LFTs: Likely due to alcohol.  Resolved.   Diarrhea: Seems to have resolved.  GI PCR and C. difficile negative. -Continue probiotic.  Thrombocytopenia: Resolved.  AKI ruled out.   Hyponatremia: Resolved   Hypokalemia -Monitor replenish as appropriate  Body mass index is 25.53 kg/m.           DVT prophylaxis:  enoxaparin (LOVENOX) injection 40 mg Start: 01/25/24 1000 SCDs Start: 01/24/24  1817 Place TED hose Start: 01/24/24 1817  Code Status: Full code Family Communication: None at bedside Level of care: Progressive Status is: Inpatient Remains inpatient appropriate because: Acute respiratory failure with hypoxia   Final disposition: Home Consultants:  Pulmonology admitted patient  55 minutes with more than 50% spent in reviewing records, counseling patient/family and coordinating care.   Sch Meds:  Scheduled Meds:  arformoterol  15 mcg Nebulization BID   budesonide (PULMICORT) nebulizer solution  0.5 mg Nebulization BID   Chlorhexidine Gluconate Cloth  6 each Topical Q0600   enoxaparin (LOVENOX) injection  40 mg Subcutaneous Q24H   fluconazole  100 mg Oral Daily   folic acid  1 mg Oral Daily   gabapentin  300 mg Oral TID   lactobacillus  1 g Oral TID WC   miconazole   Topical BID   multivitamin with minerals  1 tablet Oral Daily   PHENObarbital  32.5 mg Intravenous Q8H   revefenacin  175 mcg Nebulization Daily   sodium chloride flush  3 mL Intravenous Q12H   thiamine  100 mg Oral Daily   Continuous Infusions:  piperacillin-tazobactam (ZOSYN)  IV Stopped (02/01/24 0910)   PRN Meds:.acetaminophen **OR** acetaminophen, hydrOXYzine, ipratropium-albuterol, LORazepam, metoprolol tartrate, morphine injection, ondansetron **OR** ondansetron (ZOFRAN) IV, mouth rinse, oxyCODONE, sodium chloride flush  Antimicrobials: Anti-infectives (From admission, onward)    Start     Dose/Rate Route Frequency Ordered Stop   01/31/24 1200  fluconazole (DIFLUCAN) tablet 100 mg        100 mg Oral Daily 01/31/24 1109     01/29/24 1400  piperacillin-tazobactam (ZOSYN) IVPB 3.375 g        3.375 g 12.5 mL/hr over 240 Minutes Intravenous Every 8 hours 01/29/24 0948 02/02/24 0559   01/27/24 1945  doxycycline (VIBRAMYCIN) 100 mg in sodium chloride 0.9 % 250 mL IVPB  Status:  Discontinued        100 mg 125 mL/hr over 120 Minutes Intravenous Every 12 hours 01/27/24 1856 01/29/24 0948    01/27/24 1000  piperacillin-tazobactam (ZOSYN) IVPB 3.375 g  Status:  Discontinued        3.375 g 12.5 mL/hr over 240 Minutes Intravenous Every 8 hours 01/27/24 0929 01/29/24 0948   01/26/24 2200  doxycycline (VIBRA-TABS) tablet 100 mg  Status:  Discontinued        100 mg Oral Every 12 hours 01/26/24 1519 01/27/24 1856   01/25/24 1000  cefTRIAXone (ROCEPHIN) 2 g in sodium chloride 0.9 % 100 mL IVPB  Status:  Discontinued        2 g 200 mL/hr over 30 Minutes Intravenous Every 24 hours 01/24/24 1816 01/27/24 0929   01/25/24 1000  doxycycline (VIBRAMYCIN) 100 mg in sodium chloride 0.9 % 250 mL IVPB  Status:  Discontinued        100 mg 125 mL/hr over 120 Minutes Intravenous Every 12 hours 01/24/24 1821 01/26/24 1519   01/24/24 2200  oseltamivir (TAMIFLU) capsule 75 mg  Status:  Discontinued        75 mg Oral 2 times daily 01/24/24 1825 01/29/24 1104   01/24/24 1830  doxycycline (VIBRAMYCIN) 100 mg in sodium chloride 0.9 % 250 mL IVPB  Status:  Discontinued  100 mg 125 mL/hr over 120 Minutes Intravenous Every 12 hours 01/24/24 1816 01/24/24 1821   01/24/24 1800  cefTRIAXone (ROCEPHIN) 1 g in sodium chloride 0.9 % 100 mL IVPB        1 g 200 mL/hr over 30 Minutes Intravenous  Once 01/24/24 1753 01/24/24 2126   01/24/24 1800  azithromycin (ZITHROMAX) 500 mg in sodium chloride 0.9 % 250 mL IVPB        500 mg 250 mL/hr over 60 Minutes Intravenous  Once 01/24/24 1753 01/24/24 2227        I have personally reviewed the following labs and images: CBC: Recent Labs  Lab 01/28/24 0358 01/29/24 0413 01/30/24 0300 01/31/24 0410 02/01/24 0344  WBC 9.5 12.4* 9.0 8.7 7.3  HGB 13.2 12.0* 11.5* 12.2* 12.4*  HCT 37.2* 34.3* 33.2* 35.3* 36.1*  MCV 100.5* 101.8* 101.5* 102.0* 101.4*  PLT 172 211 237 254 274   BMP &GFR Recent Labs  Lab 01/26/24 0315 01/27/24 0330 01/27/24 1912 01/28/24 0358 01/29/24 0413 01/30/24 0300 01/31/24 0410 02/01/24 0344  NA 137 138 137 139 139 135 135 135   K 3.5 3.4* 3.6 4.0 3.9 4.0 3.8 4.2  CL 106 101 101 99 98 101 97* 97*  CO2 18* 21* 24 25 25 26 27 29   GLUCOSE 114* 106* 124* 165* 124* 118* 87 90  BUN 9 8 10 13 17 18 16 14   CREATININE 0.76 0.77 0.70 0.76 0.61 0.68 0.68 0.69  CALCIUM 7.8* 8.3* 8.2* 8.5* 8.6* 8.1* 8.0* 8.1*  MG 2.1 2.1 2.1 2.3 2.3  --  1.9 2.1  PHOS 2.0* 2.8  --  3.6 3.3  --   --   --    CrCl cannot be calculated (Unknown ideal weight.). Liver & Pancreas: Recent Labs  Lab 01/26/24 0315 01/27/24 0330 01/28/24 0358 01/29/24 0413 01/31/24 0410  AST 106* 70* 50* 33 27  ALT 32 31 34 31 44  ALKPHOS 64 70 71 62 49  BILITOT 0.5 0.9 1.0 0.8 0.9  PROT 5.1* 5.6* 5.8* 6.0* 5.5*  ALBUMIN 2.3* 2.3* 2.2* 2.2* 2.2*   No results for input(s): "LIPASE", "AMYLASE" in the last 168 hours. No results for input(s): "AMMONIA" in the last 168 hours. Diabetic: No results for input(s): "HGBA1C" in the last 72 hours. Recent Labs  Lab 01/27/24 1942 01/27/24 2351 01/28/24 0327 01/28/24 0745 01/28/24 1130  GLUCAP 117* 138* 160* 147* 134*   Cardiac Enzymes: No results for input(s): "CKTOTAL", "CKMB", "CKMBINDEX", "TROPONINI" in the last 168 hours. No results for input(s): "PROBNP" in the last 8760 hours. Coagulation Profile: No results for input(s): "INR", "PROTIME" in the last 168 hours. Thyroid Function Tests: No results for input(s): "TSH", "T4TOTAL", "FREET4", "T3FREE", "THYROIDAB" in the last 72 hours. Lipid Profile: No results for input(s): "CHOL", "HDL", "LDLCALC", "TRIG", "CHOLHDL", "LDLDIRECT" in the last 72 hours. Anemia Panel: No results for input(s): "VITAMINB12", "FOLATE", "FERRITIN", "TIBC", "IRON", "RETICCTPCT" in the last 72 hours. Urine analysis:    Component Value Date/Time   COLORURINE YELLOW 01/25/2024 0209   APPEARANCEUR CLEAR 01/25/2024 0209   LABSPEC 1.032 (H) 01/25/2024 0209   PHURINE 5.0 01/25/2024 0209   GLUCOSEU NEGATIVE 01/25/2024 0209   HGBUR MODERATE (A) 01/25/2024 0209   BILIRUBINUR NEGATIVE  01/25/2024 0209   KETONESUR NEGATIVE 01/25/2024 0209   PROTEINUR 100 (A) 01/25/2024 0209   NITRITE NEGATIVE 01/25/2024 0209   LEUKOCYTESUR NEGATIVE 01/25/2024 0209   Sepsis Labs: Invalid input(s): "PROCALCITONIN", "LACTICIDVEN"  Microbiology: Recent Results (from the past 240  hours)  Resp panel by RT-PCR (RSV, Flu A&B, Covid) Anterior Nasal Swab     Status: Abnormal   Collection Time: 01/24/24  2:16 PM   Specimen: Anterior Nasal Swab  Result Value Ref Range Status   SARS Coronavirus 2 by RT PCR NEGATIVE NEGATIVE Final   Influenza A by PCR POSITIVE (A) NEGATIVE Final   Influenza B by PCR NEGATIVE NEGATIVE Final    Comment: (NOTE) The Xpert Xpress SARS-CoV-2/FLU/RSV plus assay is intended as an aid in the diagnosis of influenza from Nasopharyngeal swab specimens and should not be used as a sole basis for treatment. Nasal washings and aspirates are unacceptable for Xpert Xpress SARS-CoV-2/FLU/RSV testing.  Fact Sheet for Patients: BloggerCourse.com  Fact Sheet for Healthcare Providers: SeriousBroker.it  This test is not yet approved or cleared by the [place] FDA and has been authorized for detection and/or diagnosis of SARS-CoV-2 by FDA under an Emergency Use Authorization (EUA). This EUA will remain in effect (meaning this test can be used) for the duration of the COVID-19 declaration under Section 564(b)(1) of the Act, 21 U.S.C. section 360bbb-3(b)(1), unless the authorization is terminated or revoked.     Resp Syncytial Virus by PCR NEGATIVE NEGATIVE Final    Comment: (NOTE) Fact Sheet for Patients: BloggerCourse.com  Fact Sheet for Healthcare Providers: SeriousBroker.it  This test is not yet approved or cleared by the [place] FDA and has been authorized for detection and/or diagnosis of SARS-CoV-2 by FDA under an Emergency Use Authorization (EUA). This EUA  will remain in effect (meaning this test can be used) for the duration of the COVID-19 declaration under Section 564(b)(1) of the Act, 21 U.S.C. section 360bbb-3(b)(1), unless the authorization is terminated or revoked.  Performed at [facility], [address], [place], [place] [address]   Culture, blood (routine x 2) Call MD if unable to obtain prior to antibiotics being given     Status: None   Collection Time: 01/24/24 10:25 PM   Specimen: BLOOD RIGHT ARM  Result Value Ref Range Status   Specimen Description BLOOD RIGHT ARM  Final   Special Requests   Final    BOTTLES DRAWN AEROBIC AND ANAEROBIC Blood Culture results may not be optimal due to an inadequate volume of blood received in culture bottles   Culture   Final    NO GROWTH 5 DAYS Performed at [facility], [address] [place], [place] [address]    Report Status 01/30/2024 FINAL  Final  Culture, blood (routine x 2) Call MD if unable to obtain prior to antibiotics being given     Status: None   Collection Time: 01/24/24 10:29 PM   Specimen: BLOOD RIGHT HAND  Result Value Ref Range Status   Specimen Description BLOOD RIGHT HAND  Final   Special Requests   Final    BOTTLES DRAWN AEROBIC AND ANAEROBIC Blood Culture results may not be optimal due to an inadequate volume of blood received in culture bottles   Culture   Final    NO GROWTH 5 DAYS Performed at [facility], [address], [place], [place] [address]    Report Status 01/30/2024 FINAL  Final  Gastrointestinal Panel by PCR , Stool     Status: None   Collection Time: 01/25/24  9:41 AM   Specimen: Stool  Result Value Ref Range Status   Campylobacter species NOT DETECTED NOT DETECTED Final   Plesimonas shigelloides NOT DETECTED NOT DETECTED Final   Salmonella  species NOT DETECTED NOT DETECTED Final   Yersinia enterocolitica NOT DETECTED NOT DETECTED Final   Vibrio species NOT DETECTED NOT DETECTED Final   Vibrio cholerae NOT DETECTED NOT  DETECTED Final   Enteroaggregative E coli (EAEC) NOT DETECTED NOT DETECTED Final   Enteropathogenic E coli (EPEC) NOT DETECTED NOT DETECTED Final   Enterotoxigenic E coli (ETEC) NOT DETECTED NOT DETECTED Final   Shiga like toxin producing E coli (STEC) NOT DETECTED NOT DETECTED Final   Shigella/Enteroinvasive E coli (EIEC) NOT DETECTED NOT DETECTED Final   Cryptosporidium NOT DETECTED NOT DETECTED Final   Cyclospora cayetanensis NOT DETECTED NOT DETECTED Final   Entamoeba histolytica NOT DETECTED NOT DETECTED Final   Giardia lamblia NOT DETECTED NOT DETECTED Final   Adenovirus F40/41 NOT DETECTED NOT DETECTED Final   Astrovirus NOT DETECTED NOT DETECTED Final   Norovirus GI/GII NOT DETECTED NOT DETECTED Final   Rotavirus A NOT DETECTED NOT DETECTED Final   Sapovirus (I, II, IV, and V) NOT DETECTED NOT DETECTED Final    Comment: Performed at [facility], [address] [place], [place] [address]  C Difficile Quick Screen w PCR reflex     Status: None   Collection Time: 01/25/24  9:42 AM   Specimen: STOOL  Result Value Ref Range Status   C Diff antigen NEGATIVE NEGATIVE Final   C Diff toxin NEGATIVE NEGATIVE Final   C Diff interpretation No C. difficile detected.  Final    Comment: Performed at [facility], [address], [place], [place] [address]  MRSA Next Gen by PCR, Nasal     Status: None   Collection Time: 01/27/24  9:31 AM   Specimen: Nasal Mucosa; Nasal Swab  Result Value Ref Range Status   MRSA by PCR Next Gen NOT DETECTED NOT DETECTED Final    Comment: (NOTE) The GeneXpert MRSA Assay (FDA approved for NASAL specimens only), is one component of a comprehensive MRSA colonization surveillance program. It is not intended to diagnose MRSA infection nor to guide or monitor treatment for MRSA infections. Test performance is not FDA approved in patients less than 74 years old. Performed at [facility], [address] [place], [place] [address]      Radiology Studies: No results found.    Lawonda Pretlow T. Nasim Habeeb [facility]  If 7PM-7AM, please contact night-coverage [email or web address] 02/01/2024, 1:06 PM

## 2024-02-02 DIAGNOSIS — J189 Pneumonia, unspecified organism: Secondary | ICD-10-CM | POA: Diagnosis not present

## 2024-02-02 DIAGNOSIS — J101 Influenza due to other identified influenza virus with other respiratory manifestations: Secondary | ICD-10-CM | POA: Diagnosis not present

## 2024-02-02 DIAGNOSIS — M79605 Pain in left leg: Secondary | ICD-10-CM | POA: Diagnosis not present

## 2024-02-02 DIAGNOSIS — E871 Hypo-osmolality and hyponatremia: Secondary | ICD-10-CM | POA: Diagnosis not present

## 2024-02-02 LAB — RENAL FUNCTION PANEL
Albumin: 2.3 g/dL — ABNORMAL LOW (ref 3.5–5.0)
Anion gap: 10 (ref 5–15)
BUN: 12 mg/dL (ref 6–20)
CO2: 28 mmol/L (ref 22–32)
Calcium: 8.5 mg/dL — ABNORMAL LOW (ref 8.9–10.3)
Chloride: 97 mmol/L — ABNORMAL LOW (ref 98–111)
Creatinine, Ser: 0.71 mg/dL (ref 0.61–1.24)
GFR, Estimated: >60 mL/min (ref >60)
Glucose, Bld: 93 mg/dL (ref 70–99)
Phosphorus: 3.5 mg/dL (ref 2.5–4.6)
Potassium: 4.3 mmol/L (ref 3.5–5.1)
Sodium: 135 mmol/L (ref 135–145)

## 2024-02-02 LAB — MAGNESIUM: Magnesium: 2 mg/dL (ref 1.7–2.4)

## 2024-02-02 LAB — CBC
HCT: 37.3 % — ABNORMAL LOW (ref 39.0–52.0)
Hemoglobin: 12.8 g/dL — ABNORMAL LOW (ref 13.0–17.0)
MCH: 35.2 pg — ABNORMAL HIGH (ref 26.0–34.0)
MCHC: 34.3 g/dL (ref 30.0–36.0)
MCV: 102.5 fL — ABNORMAL HIGH (ref 80.0–100.0)
Platelets: 291 10*3/uL (ref 150–400)
RBC: 3.64 MIL/uL — ABNORMAL LOW (ref 4.22–5.81)
RDW: 12.3 % (ref 11.5–15.5)
WBC: 7.8 10*3/uL (ref 4.0–10.5)
nRBC: 0 % (ref 0.0–0.2)

## 2024-02-02 LAB — PROCALCITONIN: Procalcitonin: <0.1 ng/mL

## 2024-02-02 NOTE — Progress Notes (Signed)
 PROGRESS NOTE  Xavier [name]:[id number] DOB: [birth year]   PCP: Jac Canavan, PA-C  Patient is from: Home.  DOA: 01/24/2024 LOS: 9  Chief complaints Chief Complaint  Patient presents with   Leg Pain   Shortness of Breath     Brief Narrative / Interim history: 58 year old M with PMH of CVA, CHF, ACID, CAD, asthma, DM-2, OSA, HTN, tobacco use disorder and alcohol use disorder presented to ED with shortness of breath and leg pain, and admitted with working diagnosis of acute respiratory failure with hypoxia due to multifocal pneumonia, influenza A infection and A-fib with RVR.  CXR with right upper lobe infiltrate.  CT angio chest negative for PE but multifocal and confluent areas of groundglass opacity throughout both lungs.  Patient was initially admitted to ICU and required BiPAP.  Eventually, weaned to [facility] and transferred to hospitalist service. Completed antibiotic course but still with significant oxygen requirement without significant respiratory distress  Subjective: Seen and examined earlier this morning.  No major events overnight of this morning.  No complaints.  He was weaned to 20 L 60% FiO2.  Respiratory planning to wean further.   Objective: Vitals:   02/02/24 0600 02/02/24 0700 02/02/24 0749 02/02/24 0807  BP: 108/62 111/68    Pulse: 65 65  77  Resp: 19 17  19   Temp:   97.8 F (36.6 C)   TempSrc:   Oral   SpO2: 94% 95%  91%  Weight:        Examination:  GENERAL: No apparent distress.  Nontoxic. HEENT: MMM.  Vision and hearing grossly intact.  NECK: Supple.  No apparent JVD.  RESP:  No IWOB.  Fair aeration bilaterally.  Some rhonchi bilaterally. CVS:  RRR. Heart sounds normal.  ABD/GI/GU: BS+. Abd soft, NTND.  MSK/EXT:  Moves extremities. No apparent deformity. No edema.  SKIN: no apparent skin lesion or wound NEURO: Awake, alert and oriented appropriately.  No apparent focal neuro deficit. PSYCH: Calm. Normal affect.   Procedures:   None  Microbiology summarized: 2/7-influenza A PCR positive 2/7-COVID-19, influenza B and RSV PCR nonreactive 2/7-blood cultures NGTD 2/8-GIP and C. difficile negative. 2/10-MRSA PCR nonreactive    Assessment and plan: Acute hypoxic respiratory failure due to due to multifocal community-acquired pneumonia and influenza A infection: Initially required BiPAP.  CT angio chest negative for PE but multifocal pneumonia in both lungs.  Microbiology as above.  Procalcitonin negative. -Completed course for Tamiflu and steroid. -CTX 2/7-2/10.  Zosyn 2/12-2/16 -Zithromax 2/7>> doxycycline 2/8-2/12. -Continue scheduled and as needed nebulizers. -Wean oxygen as able -Incentive spirometry, OOB/PT/OT  Acute alcohol withdrawal: Reports drinking about 6 beers a day.  Denies major withdrawal symptoms in the past.  She would be outside withdrawal window now. -Discontinued CIWA. -Continue phenobarbital taper -Continue multivitamin, thiamine and folic acid -Encouraged alcohol cessation   Paroxysmal A-fib with RVR: Short-lived.  Converted to NSR.  Likely due to alcohol and respiratory failure.  -Encourage alcohol cessation  Elevated troponin: Likely demand ischemia in the setting of the above.  TTE without significant finding.  Skin rash: Fungal infection? -Continue miconazole cream   Tobacco use disorder: Reports smoking 10 to 15 cigarettes a day. -Encouraged cessation.   Left-sided leg pain: Venous Doppler negative for DVT. -Pain control   Elevated LFTs: Likely due to alcohol.  Resolved.   Diarrhea: Seems to have resolved.  GI PCR and C. difficile negative. -Continue probiotic.  Thrombocytopenia: Resolved.  AKI ruled out.   Hyponatremia: Resolved   Hypokalemia -  Monitor replenish as appropriate  Body mass index is 24.86 kg/m.           DVT prophylaxis:  enoxaparin (LOVENOX) injection 40 mg Start: 01/25/24 1000 SCDs Start: 01/24/24 1817 Place TED hose Start: 01/24/24  1817  Code Status: Full code Family Communication: None at bedside Level of care: Progressive Status is: Inpatient Remains inpatient appropriate because: Acute respiratory failure with hypoxia   Final disposition: Home Consultants:  Pulmonology admitted patient  55 minutes with more than 50% spent in reviewing records, counseling patient/family and coordinating care.   Sch Meds:  Scheduled Meds:  arformoterol  15 mcg Nebulization BID   budesonide (PULMICORT) nebulizer solution  0.5 mg Nebulization BID   Chlorhexidine Gluconate Cloth  6 each Topical Q0600   enoxaparin (LOVENOX) injection  40 mg Subcutaneous Q24H   folic acid  1 mg Oral Daily   gabapentin  300 mg Oral TID   lactobacillus  1 g Oral TID WC   miconazole   Topical BID   multivitamin with minerals  1 tablet Oral Daily   phenobarbital  32.4 mg Oral Q8H   revefenacin  175 mcg Nebulization Daily   sodium chloride flush  3 mL Intravenous Q12H   thiamine  100 mg Oral Daily   Continuous Infusions:   PRN Meds:.acetaminophen **OR** acetaminophen, hydrOXYzine, ipratropium-albuterol, metoprolol tartrate, morphine injection, ondansetron **OR** ondansetron (ZOFRAN) IV, mouth rinse, oxyCODONE, sodium chloride flush  Antimicrobials: Anti-infectives (From admission, onward)    Start     Dose/Rate Route Frequency Ordered Stop   01/31/24 1200  fluconazole (DIFLUCAN) tablet 100 mg  Status:  Discontinued        100 mg Oral Daily 01/31/24 1109 02/01/24 1320   01/29/24 1400  piperacillin-tazobactam (ZOSYN) IVPB 3.375 g        3.375 g 12.5 mL/hr over 240 Minutes Intravenous Every 8 hours 01/29/24 0948 02/02/24 0115   01/27/24 1945  doxycycline (VIBRAMYCIN) 100 mg in sodium chloride 0.9 % 250 mL IVPB  Status:  Discontinued        100 mg 125 mL/hr over 120 Minutes Intravenous Every 12 hours 01/27/24 1856 01/29/24 0948   01/27/24 1000  piperacillin-tazobactam (ZOSYN) IVPB 3.375 g  Status:  Discontinued        3.375 g 12.5 mL/hr  over 240 Minutes Intravenous Every 8 hours 01/27/24 0929 01/29/24 0948   01/26/24 2200  doxycycline (VIBRA-TABS) tablet 100 mg  Status:  Discontinued        100 mg Oral Every 12 hours 01/26/24 1519 01/27/24 1856   01/25/24 1000  cefTRIAXone (ROCEPHIN) 2 g in sodium chloride 0.9 % 100 mL IVPB  Status:  Discontinued        2 g 200 mL/hr over 30 Minutes Intravenous Every 24 hours 01/24/24 1816 01/27/24 0929   01/25/24 1000  doxycycline (VIBRAMYCIN) 100 mg in sodium chloride 0.9 % 250 mL IVPB  Status:  Discontinued        100 mg 125 mL/hr over 120 Minutes Intravenous Every 12 hours 01/24/24 1821 01/26/24 1519   01/24/24 2200  oseltamivir (TAMIFLU) capsule 75 mg  Status:  Discontinued        75 mg Oral 2 times daily 01/24/24 1825 01/29/24 1104   01/24/24 1830  doxycycline (VIBRAMYCIN) 100 mg in sodium chloride 0.9 % 250 mL IVPB  Status:  Discontinued        100 mg 125 mL/hr over 120 Minutes Intravenous Every 12 hours 01/24/24 1816 01/24/24 1821   01/24/24 1800  cefTRIAXone (ROCEPHIN) 1 g in sodium chloride 0.9 % 100 mL IVPB        1 g 200 mL/hr over 30 Minutes Intravenous  Once 01/24/24 1753 01/24/24 2126   01/24/24 1800  azithromycin (ZITHROMAX) 500 mg in sodium chloride 0.9 % 250 mL IVPB        500 mg 250 mL/hr over 60 Minutes Intravenous  Once 01/24/24 1753 01/24/24 2227        I have personally reviewed the following labs and images: CBC: Recent Labs  Lab 01/29/24 0413 01/30/24 0300 01/31/24 0410 02/01/24 0344 02/02/24 0426  WBC 12.4* 9.0 8.7 7.3 7.8  HGB 12.0* 11.5* 12.2* 12.4* 12.8*  HCT 34.3* 33.2* 35.3* 36.1* 37.3*  MCV 101.8* 101.5* 102.0* 101.4* 102.5*  PLT 211 237 254 274 291   BMP &GFR Recent Labs  Lab 01/27/24 0330 01/27/24 1912 01/28/24 0358 01/29/24 0413 01/30/24 0300 01/31/24 0410 02/01/24 0344 02/02/24 0426  NA 138   < > 139 139 135 135 135 135  K 3.4*   < > 4.0 3.9 4.0 3.8 4.2 4.3  CL 101   < > 99 98 101 97* 97* 97*  CO2 21*   < > 25 25 26 27 29 28    GLUCOSE 106*   < > 165* 124* 118* 87 90 93  BUN 8   < > 13 17 18 16 14 12   CREATININE 0.77   < > 0.76 0.61 0.68 0.68 0.69 0.71  CALCIUM 8.3*   < > 8.5* 8.6* 8.1* 8.0* 8.1* 8.5*  MG 2.1   < > 2.3 2.3  --  1.9 2.1 2.0  PHOS 2.8  --  3.6 3.3  --   --   --  3.5   < > = values in this interval not displayed.   CrCl cannot be calculated (Unknown ideal weight.). Liver & Pancreas: Recent Labs  Lab 01/27/24 0330 01/28/24 0358 01/29/24 0413 01/31/24 0410 02/02/24 0426  AST 70* 50* 33 27  --   ALT 31 34 31 44  --   ALKPHOS 70 71 62 49  --   BILITOT 0.9 1.0 0.8 0.9  --   PROT 5.6* 5.8* 6.0* 5.5*  --   ALBUMIN 2.3* 2.2* 2.2* 2.2* 2.3*   No results for input(s): "LIPASE", "AMYLASE" in the last 168 hours. No results for input(s): "AMMONIA" in the last 168 hours. Diabetic: No results for input(s): "HGBA1C" in the last 72 hours. Recent Labs  Lab 01/27/24 1942 01/27/24 2351 01/28/24 0327 01/28/24 0745 01/28/24 1130  GLUCAP 117* 138* 160* 147* 134*   Cardiac Enzymes: No results for input(s): "CKTOTAL", "CKMB", "CKMBINDEX", "TROPONINI" in the last 168 hours. No results for input(s): "PROBNP" in the last 8760 hours. Coagulation Profile: No results for input(s): "INR", "PROTIME" in the last 168 hours. Thyroid Function Tests: No results for input(s): "TSH", "T4TOTAL", "FREET4", "T3FREE", "THYROIDAB" in the last 72 hours. Lipid Profile: No results for input(s): "CHOL", "HDL", "LDLCALC", "TRIG", "CHOLHDL", "LDLDIRECT" in the last 72 hours. Anemia Panel: No results for input(s): "VITAMINB12", "FOLATE", "FERRITIN", "TIBC", "IRON", "RETICCTPCT" in the last 72 hours. Urine analysis:    Component Value Date/Time   COLORURINE YELLOW 01/25/2024 0209   APPEARANCEUR CLEAR 01/25/2024 0209   LABSPEC 1.032 (H) 01/25/2024 0209   PHURINE 5.0 01/25/2024 0209   GLUCOSEU NEGATIVE 01/25/2024 0209   HGBUR MODERATE (A) 01/25/2024 0209   BILIRUBINUR NEGATIVE 01/25/2024 0209   KETONESUR NEGATIVE  01/25/2024 0209   PROTEINUR 100 (A) 01/25/2024 0209  NITRITE NEGATIVE 01/25/2024 0209   LEUKOCYTESUR NEGATIVE 01/25/2024 0209   Sepsis Labs: Invalid input(s): "PROCALCITONIN", "LACTICIDVEN"  Microbiology: Recent Results (from the past 240 hours)  Resp panel by RT-PCR (RSV, Flu A&B, Covid) Anterior Nasal Swab     Status: Abnormal   Collection Time: 01/24/24  2:16 PM   Specimen: Anterior Nasal Swab  Result Value Ref Range Status   SARS Coronavirus 2 by RT PCR NEGATIVE NEGATIVE Final   Influenza A by PCR POSITIVE (A) NEGATIVE Final   Influenza B by PCR NEGATIVE NEGATIVE Final    Comment: (NOTE) The Xpert Xpress SARS-CoV-2/FLU/RSV plus assay is intended as an aid in the diagnosis of influenza from Nasopharyngeal swab specimens and should not be used as a sole basis for treatment. Nasal washings and aspirates are unacceptable for Xpert Xpress SARS-CoV-2/FLU/RSV testing.  Fact Sheet for Patients: BloggerCourse.com  Fact Sheet for Healthcare Providers: SeriousBroker.it  This test is not yet approved or cleared by the [place] FDA and has been authorized for detection and/or diagnosis of SARS-CoV-2 by FDA under an Emergency Use Authorization (EUA). This EUA will remain in effect (meaning this test can be used) for the duration of the COVID-19 declaration under Section 564(b)(1) of the Act, 21 U.S.C. section 360bbb-3(b)(1), unless the authorization is terminated or revoked.     Resp Syncytial Virus by PCR NEGATIVE NEGATIVE Final    Comment: (NOTE) Fact Sheet for Patients: BloggerCourse.com  Fact Sheet for Healthcare Providers: SeriousBroker.it  This test is not yet approved or cleared by the [place] FDA and has been authorized for detection and/or diagnosis of SARS-CoV-2 by FDA under an Emergency Use Authorization (EUA). This EUA will remain in effect (meaning this  test can be used) for the duration of the COVID-19 declaration under Section 564(b)(1) of the Act, 21 U.S.C. section 360bbb-3(b)(1), unless the authorization is terminated or revoked.  Performed at [facility], [address] [place], [place] [address]   Culture, blood (routine x 2) Call MD if unable to obtain prior to antibiotics being given     Status: None   Collection Time: 01/24/24 10:25 PM   Specimen: BLOOD RIGHT ARM  Result Value Ref Range Status   Specimen Description BLOOD RIGHT ARM  Final   Special Requests   Final    BOTTLES DRAWN AEROBIC AND ANAEROBIC Blood Culture results may not be optimal due to an inadequate volume of blood received in culture bottles   Culture   Final    NO GROWTH 5 DAYS Performed at [facility], [address], [place], [place] [address]    Report Status 01/30/2024 FINAL  Final  Culture, blood (routine x 2) Call MD if unable to obtain prior to antibiotics being given     Status: None   Collection Time: 01/24/24 10:29 PM   Specimen: BLOOD RIGHT HAND  Result Value Ref Range Status   Specimen Description BLOOD RIGHT HAND  Final   Special Requests   Final    BOTTLES DRAWN AEROBIC AND ANAEROBIC Blood Culture results may not be optimal due to an inadequate volume of blood received in culture bottles   Culture   Final    NO GROWTH 5 DAYS Performed at [facility], [address], [place], [place] [address]    Report Status 01/30/2024 FINAL  Final  Gastrointestinal Panel by PCR , Stool     Status: None   Collection Time: 01/25/24  9:41 AM   Specimen: Stool  Result Value Ref Range Status   Campylobacter species NOT DETECTED NOT DETECTED Final   Plesimonas shigelloides NOT DETECTED NOT DETECTED Final   Salmonella species NOT DETECTED NOT DETECTED Final   Yersinia enterocolitica NOT DETECTED NOT DETECTED Final   Vibrio species NOT DETECTED NOT DETECTED Final   Vibrio cholerae NOT DETECTED NOT DETECTED Final   Enteroaggregative E  coli (EAEC) NOT DETECTED NOT DETECTED Final   Enteropathogenic E coli (EPEC) NOT DETECTED NOT DETECTED Final   Enterotoxigenic E coli (ETEC) NOT DETECTED NOT DETECTED Final   Shiga like toxin producing E coli (STEC) NOT DETECTED NOT DETECTED Final   Shigella/Enteroinvasive E coli (EIEC) NOT DETECTED NOT DETECTED Final   Cryptosporidium NOT DETECTED NOT DETECTED Final   Cyclospora cayetanensis NOT DETECTED NOT DETECTED Final   Entamoeba histolytica NOT DETECTED NOT DETECTED Final   Giardia lamblia NOT DETECTED NOT DETECTED Final   Adenovirus F40/41 NOT DETECTED NOT DETECTED Final   Astrovirus NOT DETECTED NOT DETECTED Final   Norovirus GI/GII NOT DETECTED NOT DETECTED Final   Rotavirus A NOT DETECTED NOT DETECTED Final   Sapovirus (I, II, IV, and V) NOT DETECTED NOT DETECTED Final    Comment: Performed at [facility], [address] [place], [place] [address]  C Difficile Quick Screen w PCR reflex     Status: None   Collection Time: 01/25/24  9:42 AM   Specimen: STOOL  Result Value Ref Range Status   C Diff antigen NEGATIVE NEGATIVE Final   C Diff toxin NEGATIVE NEGATIVE Final   C Diff interpretation No C. difficile detected.  Final    Comment: Performed at [facility], [address] [place], [place] [address]  MRSA Next Gen by PCR, Nasal     Status: None   Collection Time: 01/27/24  9:31 AM   Specimen: Nasal Mucosa; Nasal Swab  Result Value Ref Range Status   MRSA by PCR Next Gen NOT DETECTED NOT DETECTED Final    Comment: (NOTE) The GeneXpert MRSA Assay (FDA approved for NASAL specimens only), is one component of a comprehensive MRSA colonization surveillance program. It is not intended to diagnose MRSA infection nor to guide or monitor treatment for MRSA infections. Test performance is not FDA approved in patients less than 7 years old. Performed at [facility], [address] [place], [place] [address]     Radiology Studies: No results  found.    Jaicey Sweaney T. Rhylen Pulido [facility]  If 7PM-7AM, please contact night-coverage [email or web address] 02/02/2024, 11:14 AM

## 2024-02-02 NOTE — Plan of Care (Signed)
  Problem: Clinical Measurements: Goal: Ability to maintain clinical measurements within normal limits will improve Outcome: Progressing   Problem: Education: Goal: Knowledge of General Education information will improve Description: Including pain rating scale, medication(s)/side effects and non-pharmacologic comfort measures Outcome: Progressing   Problem: Coping: Goal: Level of anxiety will decrease Outcome: Progressing

## 2024-02-03 DIAGNOSIS — E871 Hypo-osmolality and hyponatremia: Secondary | ICD-10-CM | POA: Diagnosis not present

## 2024-02-03 DIAGNOSIS — M79605 Pain in left leg: Secondary | ICD-10-CM | POA: Diagnosis not present

## 2024-02-03 DIAGNOSIS — J101 Influenza due to other identified influenza virus with other respiratory manifestations: Secondary | ICD-10-CM | POA: Diagnosis not present

## 2024-02-03 DIAGNOSIS — J189 Pneumonia, unspecified organism: Secondary | ICD-10-CM | POA: Diagnosis not present

## 2024-02-03 MED ORDER — TERBINAFINE HCL 1 % EX CREA
TOPICAL_CREAM | Freq: Two times a day (BID) | CUTANEOUS | Status: DC
  Administered 2024-02-03: 1 via TOPICAL
  Filled 2024-02-03: qty 12

## 2024-02-03 NOTE — Plan of Care (Signed)

## 2024-02-03 NOTE — Progress Notes (Signed)
 Messan RN was given report. Pt transferred without incident via wheelchair. All pt belongings were transferred with patient, including 4 patient belonging bags, 1 cell phone and charger, 1 pair of brown boots, 1 black Bible, 1 fan.

## 2024-02-03 NOTE — TOC Progression Note (Signed)
 Transition of Care [facility]) - Progression Note    Patient Details  Name: Xavier [name] MRN: [id number] Date of Birth: [birth year]  [facility] of [facility]) CM/SW Contact  Tom-Johnson, Hershal Coria, RN Phone Number: 02/03/2024, 3:11 PM  Clinical Narrative:     Patient completed his Abc treatment course. O2 requirement improved, currently on 7L HFNC.  Completed Phenobarbital taper Continues on Multivitamin, Thiamine and Folic Acid for Alcohol withdrawals.   Patient not Medically ready for discharge.  CM will continue to follow as patient progresses with care towards discharge.              Expected Discharge Plan and Services                                               Social Determinants of Health (SDOH) Interventions SDOH Screenings   Food Insecurity: No Food Insecurity (01/25/2024)  Housing: Low Risk  (02/03/2024)  Transportation Needs: No Transportation Needs (01/25/2024)  Utilities: Not At Risk (01/25/2024)  Tobacco Use: High Risk (01/24/2024)    Readmission Risk Interventions    01/28/2024    2:10 PM  Readmission Risk Prevention Plan  Transportation Screening Complete  PCP or Specialist Appt within 5-7 Days Complete  Home Care Screening Complete  Medication Review (RN CM) Referral to Pharmacy

## 2024-02-03 NOTE — Progress Notes (Signed)
 PROGRESS NOTE  Xavier [name] ZOX:[id number] DOB: 01/22/1966   PCP: Jac Canavan, PA-C  Patient is from: Home.  DOA: 01/24/2024 LOS: 10  Chief complaints Chief Complaint  Patient presents with   Leg Pain   Shortness of Breath     Brief Narrative / Interim history: 58 year old M with PMH of CVA, CHF, ACID, CAD, asthma, DM-2, OSA, HTN, tobacco use disorder and alcohol use disorder presented to ED with shortness of breath and leg pain, and admitted with working diagnosis of acute respiratory failure with hypoxia due to multifocal pneumonia, influenza A infection and A-fib with RVR.  CXR with right upper lobe infiltrate.  CT angio chest negative for PE but multifocal and confluent areas of groundglass opacity throughout both lungs.  Patient was initially admitted to ICU and required BiPAP.  Eventually, weaned to [facility] and transferred to hospitalist service.  Completed antibiotic course.  Oxygen requirement improved.  Currently on 7 L by HFNC.  Subjective: Seen and examined earlier this morning.  No major events overnight of this morning.  No complaints.  Currently saturating in low 90s on several liters by HFNC.  Objective: Vitals:   02/03/24 0736 02/03/24 0800 02/03/24 0852 02/03/24 0900  BP:  121/84 110/80   Pulse:  77 85 80  Resp:  (!) 23 (!) 22 18  Temp: 98 F (36.7 C)     TempSrc: Oral     SpO2:  93%  94%  Weight:        Examination:  GENERAL: No apparent distress.  Nontoxic. HEENT: MMM.  Vision and hearing grossly intact.  NECK: Supple.  No apparent JVD.  RESP:  No IWOB.  Fair aeration bilaterally.  Some rhonchi bilaterally. CVS:  RRR. Heart sounds normal.  ABD/GI/GU: BS+. Abd soft, NTND.  MSK/EXT:  Moves extremities. No apparent deformity. No edema.  SKIN: Scaly red skin rash with clear demarcation on his buttock and posterior thigh NEURO: Awake, alert and oriented appropriately.  No apparent focal neuro deficit. PSYCH: Calm. Normal affect.   Procedures:   None  Microbiology summarized: 2/7-influenza A PCR positive 2/7-COVID-19, influenza B and RSV PCR nonreactive 2/7-blood cultures NGTD 2/8-GIP and C. difficile negative. 2/10-MRSA PCR nonreactive    Assessment and plan: Acute hypoxic respiratory failure due to due to multifocal community-acquired pneumonia and influenza A infection: Initially required BiPAP.  CT angio chest negative for PE but multifocal pneumonia in both lungs.  Microbiology as above.  Procalcitonin negative.  Oxygen requirement improved.  Currently saturating at 94% on several liters by HFNC. -Completed course for Tamiflu and steroid. -CTX 2/7-2/10.  Zosyn 2/12-2/16 -Zithromax 2/7>> doxycycline 2/8-2/12. -Continue scheduled and as needed nebulizers. -Wean oxygen as able -Incentive spirometry, OOB/PT/OT  Acute alcohol withdrawal: Reports drinking about 6 beers a day.  Denies major withdrawal symptoms in the past.  She would be outside withdrawal window now.  Completed phenobarbital taper -Continue multivitamin, thiamine and folic acid -Encouraged alcohol cessation   Paroxysmal A-fib with RVR: Short-lived.  Converted to NSR.  Likely due to alcohol and respiratory failure.  -Encourage alcohol cessation  Elevated troponin: Likely demand ischemia in the setting of the above.  TTE without significant finding.  Skin rash on bilateral buttock and left upper posterior thigh: Concerning for tinea corporis -Change miconazole to terbinafine cream -Discontinue probiotic.   Tobacco use disorder: Reports smoking 10 to 15 cigarettes a day. -Encouraged cessation.   Left-sided leg pain: Venous Doppler negative for DVT. -Pain control   Elevated LFTs: Likely due to  alcohol.  Resolved.   Diarrhea: Seems to have resolved.  GI PCR and C. difficile negative. -Discontinue probiotic given possible fungal infection of skin  Hypokalemia -Monitor replenish as appropriate  Thrombocytopenia: Resolved.  AKI ruled out.    Hyponatremia: Resolved    Body mass index is 24.86 kg/m.           DVT prophylaxis:  enoxaparin (LOVENOX) injection 40 mg Start: 01/25/24 1000 SCDs Start: 01/24/24 1817 Place TED hose Start: 01/24/24 1817  Code Status: Full code Family Communication: None at bedside Level of care: Progressive Status is: Inpatient Remains inpatient appropriate because: Acute respiratory failure with hypoxia   Final disposition: Home Consultants:  Pulmonology admitted patient  55 minutes with more than 50% spent in reviewing records, counseling patient/family and coordinating care.   Sch Meds:  Scheduled Meds:  arformoterol  15 mcg Nebulization BID   budesonide (PULMICORT) nebulizer solution  0.5 mg Nebulization BID   Chlorhexidine Gluconate Cloth  6 each Topical Q0600   enoxaparin (LOVENOX) injection  40 mg Subcutaneous Q24H   folic acid  1 mg Oral Daily   gabapentin  300 mg Oral TID   lactobacillus  1 g Oral TID WC   multivitamin with minerals  1 tablet Oral Daily   revefenacin  175 mcg Nebulization Daily   sodium chloride flush  3 mL Intravenous Q12H   terbinafine   Topical BID   thiamine  100 mg Oral Daily   Continuous Infusions:   PRN Meds:.acetaminophen **OR** acetaminophen, hydrOXYzine, ipratropium-albuterol, metoprolol tartrate, morphine injection, ondansetron **OR** ondansetron (ZOFRAN) IV, mouth rinse, oxyCODONE, sodium chloride flush  Antimicrobials: Anti-infectives (From admission, onward)    Start     Dose/Rate Route Frequency Ordered Stop   01/31/24 1200  fluconazole (DIFLUCAN) tablet 100 mg  Status:  Discontinued        100 mg Oral Daily 01/31/24 1109 02/01/24 1320   01/29/24 1400  piperacillin-tazobactam (ZOSYN) IVPB 3.375 g        3.375 g 12.5 mL/hr over 240 Minutes Intravenous Every 8 hours 01/29/24 0948 02/02/24 0115   01/27/24 1945  doxycycline (VIBRAMYCIN) 100 mg in sodium chloride 0.9 % 250 mL IVPB  Status:  Discontinued        100 mg 125 mL/hr over  120 Minutes Intravenous Every 12 hours 01/27/24 1856 01/29/24 0948   01/27/24 1000  piperacillin-tazobactam (ZOSYN) IVPB 3.375 g  Status:  Discontinued        3.375 g 12.5 mL/hr over 240 Minutes Intravenous Every 8 hours 01/27/24 0929 01/29/24 0948   01/26/24 2200  doxycycline (VIBRA-TABS) tablet 100 mg  Status:  Discontinued        100 mg Oral Every 12 hours 01/26/24 1519 01/27/24 1856   01/25/24 1000  cefTRIAXone (ROCEPHIN) 2 g in sodium chloride 0.9 % 100 mL IVPB  Status:  Discontinued        2 g 200 mL/hr over 30 Minutes Intravenous Every 24 hours 01/24/24 1816 01/27/24 0929   01/25/24 1000  doxycycline (VIBRAMYCIN) 100 mg in sodium chloride 0.9 % 250 mL IVPB  Status:  Discontinued        100 mg 125 mL/hr over 120 Minutes Intravenous Every 12 hours 01/24/24 1821 01/26/24 1519   01/24/24 2200  oseltamivir (TAMIFLU) capsule 75 mg  Status:  Discontinued        75 mg Oral 2 times daily 01/24/24 1825 01/29/24 1104   01/24/24 1830  doxycycline (VIBRAMYCIN) 100 mg in sodium chloride 0.9 % 250 mL  IVPB  Status:  Discontinued        100 mg 125 mL/hr over 120 Minutes Intravenous Every 12 hours 01/24/24 1816 01/24/24 1821   01/24/24 1800  cefTRIAXone (ROCEPHIN) 1 g in sodium chloride 0.9 % 100 mL IVPB        1 g 200 mL/hr over 30 Minutes Intravenous  Once 01/24/24 1753 01/24/24 2126   01/24/24 1800  azithromycin (ZITHROMAX) 500 mg in sodium chloride 0.9 % 250 mL IVPB        500 mg 250 mL/hr over 60 Minutes Intravenous  Once 01/24/24 1753 01/24/24 2227        I have personally reviewed the following labs and images: CBC: Recent Labs  Lab 01/29/24 0413 01/30/24 0300 01/31/24 0410 02/01/24 0344 02/02/24 0426  WBC 12.4* 9.0 8.7 7.3 7.8  HGB 12.0* 11.5* 12.2* 12.4* 12.8*  HCT 34.3* 33.2* 35.3* 36.1* 37.3*  MCV 101.8* 101.5* 102.0* 101.4* 102.5*  PLT 211 237 254 274 291   BMP &GFR Recent Labs  Lab 01/28/24 0358 01/29/24 0413 01/30/24 0300 01/31/24 0410 02/01/24 0344 02/02/24 0426   NA 139 139 135 135 135 135  K 4.0 3.9 4.0 3.8 4.2 4.3  CL 99 98 101 97* 97* 97*  CO2 25 25 26 27 29 28   GLUCOSE 165* 124* 118* 87 90 93  BUN 13 17 18 16 14 12   CREATININE 0.76 0.61 0.68 0.68 0.69 0.71  CALCIUM 8.5* 8.6* 8.1* 8.0* 8.1* 8.5*  MG 2.3 2.3  --  1.9 2.1 2.0  PHOS 3.6 3.3  --   --   --  3.5   CrCl cannot be calculated (Unknown ideal weight.). Liver & Pancreas: Recent Labs  Lab 01/28/24 0358 01/29/24 0413 01/31/24 0410 02/02/24 0426  AST 50* 33 27  --   ALT 34 31 44  --   ALKPHOS 71 62 49  --   BILITOT 1.0 0.8 0.9  --   PROT 5.8* 6.0* 5.5*  --   ALBUMIN 2.2* 2.2* 2.2* 2.3*   No results for input(s): "LIPASE", "AMYLASE" in the last 168 hours. No results for input(s): "AMMONIA" in the last 168 hours. Diabetic: No results for input(s): "HGBA1C" in the last 72 hours. Recent Labs  Lab 01/27/24 1942 01/27/24 2351 01/28/24 0327 01/28/24 0745 01/28/24 1130  GLUCAP 117* 138* 160* 147* 134*   Cardiac Enzymes: No results for input(s): "CKTOTAL", "CKMB", "CKMBINDEX", "TROPONINI" in the last 168 hours. No results for input(s): "PROBNP" in the last 8760 hours. Coagulation Profile: No results for input(s): "INR", "PROTIME" in the last 168 hours. Thyroid Function Tests: No results for input(s): "TSH", "T4TOTAL", "FREET4", "T3FREE", "THYROIDAB" in the last 72 hours. Lipid Profile: No results for input(s): "CHOL", "HDL", "LDLCALC", "TRIG", "CHOLHDL", "LDLDIRECT" in the last 72 hours. Anemia Panel: No results for input(s): "VITAMINB12", "FOLATE", "FERRITIN", "TIBC", "IRON", "RETICCTPCT" in the last 72 hours. Urine analysis:    Component Value Date/Time   COLORURINE YELLOW 01/25/2024 0209   APPEARANCEUR CLEAR 01/25/2024 0209   LABSPEC 1.032 (H) 01/25/2024 0209   PHURINE 5.0 01/25/2024 0209   GLUCOSEU NEGATIVE 01/25/2024 0209   HGBUR MODERATE (A) 01/25/2024 0209   BILIRUBINUR NEGATIVE 01/25/2024 0209   KETONESUR NEGATIVE 01/25/2024 0209   PROTEINUR 100 (A)  01/25/2024 0209   NITRITE NEGATIVE 01/25/2024 0209   LEUKOCYTESUR NEGATIVE 01/25/2024 0209   Sepsis Labs: Invalid input(s): "PROCALCITONIN", "LACTICIDVEN"  Microbiology: Recent Results (from the past 240 hours)  Resp panel by RT-PCR (RSV, Flu A&B, Covid) Anterior Nasal  Swab     Status: Abnormal   Collection Time: 01/24/24  2:16 PM   Specimen: Anterior Nasal Swab  Result Value Ref Range Status   SARS Coronavirus 2 by RT PCR NEGATIVE NEGATIVE Final   Influenza A by PCR POSITIVE (A) NEGATIVE Final   Influenza B by PCR NEGATIVE NEGATIVE Final    Comment: (NOTE) The Xpert Xpress SARS-CoV-2/FLU/RSV plus assay is intended as an aid in the diagnosis of influenza from Nasopharyngeal swab specimens and should not be used as a sole basis for treatment. Nasal washings and aspirates are unacceptable for Xpert Xpress SARS-CoV-2/FLU/RSV testing.  Fact Sheet for Patients: BloggerCourse.com  Fact Sheet for Healthcare Providers: SeriousBroker.it  This test is not yet approved or cleared by the [place] FDA and has been authorized for detection and/or diagnosis of SARS-CoV-2 by FDA under an Emergency Use Authorization (EUA). This EUA will remain in effect (meaning this test can be used) for the duration of the COVID-19 declaration under Section 564(b)(1) of the Act, 21 U.S.C. section 360bbb-3(b)(1), unless the authorization is terminated or revoked.     Resp Syncytial Virus by PCR NEGATIVE NEGATIVE Final    Comment: (NOTE) Fact Sheet for Patients: BloggerCourse.com  Fact Sheet for Healthcare Providers: SeriousBroker.it  This test is not yet approved or cleared by the [place] FDA and has been authorized for detection and/or diagnosis of SARS-CoV-2 by FDA under an Emergency Use Authorization (EUA). This EUA will remain in effect (meaning this test can be used) for the duration of  the COVID-19 declaration under Section 564(b)(1) of the Act, 21 U.S.C. section 360bbb-3(b)(1), unless the authorization is terminated or revoked.  Performed at [facility], [address], [place], [place] [address]   Culture, blood (routine x 2) Call MD if unable to obtain prior to antibiotics being given     Status: None   Collection Time: 01/24/24 10:25 PM   Specimen: BLOOD RIGHT ARM  Result Value Ref Range Status   Specimen Description BLOOD RIGHT ARM  Final   Special Requests   Final    BOTTLES DRAWN AEROBIC AND ANAEROBIC Blood Culture results may not be optimal due to an inadequate volume of blood received in culture bottles   Culture   Final    NO GROWTH 5 DAYS Performed at [facility], [address], [place], [place] [address]    Report Status 01/30/2024 FINAL  Final  Culture, blood (routine x 2) Call MD if unable to obtain prior to antibiotics being given     Status: None   Collection Time: 01/24/24 10:29 PM   Specimen: BLOOD RIGHT HAND  Result Value Ref Range Status   Specimen Description BLOOD RIGHT HAND  Final   Special Requests   Final    BOTTLES DRAWN AEROBIC AND ANAEROBIC Blood Culture results may not be optimal due to an inadequate volume of blood received in culture bottles   Culture   Final    NO GROWTH 5 DAYS Performed at [facility], [address], [place], [place] [address]    Report Status 01/30/2024 FINAL  Final  Gastrointestinal Panel by PCR , Stool     Status: None   Collection Time: 01/25/24  9:41 AM   Specimen: Stool  Result Value Ref Range Status   Campylobacter species NOT DETECTED NOT DETECTED Final   Plesimonas shigelloides NOT DETECTED NOT DETECTED Final   Salmonella species NOT DETECTED NOT DETECTED Final   Yersinia enterocolitica NOT DETECTED  NOT DETECTED Final   Vibrio species NOT DETECTED NOT DETECTED Final   Vibrio cholerae NOT DETECTED NOT DETECTED Final   Enteroaggregative E coli (EAEC) NOT DETECTED NOT DETECTED  Final   Enteropathogenic E coli (EPEC) NOT DETECTED NOT DETECTED Final   Enterotoxigenic E coli (ETEC) NOT DETECTED NOT DETECTED Final   Shiga like toxin producing E coli (STEC) NOT DETECTED NOT DETECTED Final   Shigella/Enteroinvasive E coli (EIEC) NOT DETECTED NOT DETECTED Final   Cryptosporidium NOT DETECTED NOT DETECTED Final   Cyclospora cayetanensis NOT DETECTED NOT DETECTED Final   Entamoeba histolytica NOT DETECTED NOT DETECTED Final   Giardia lamblia NOT DETECTED NOT DETECTED Final   Adenovirus F40/41 NOT DETECTED NOT DETECTED Final   Astrovirus NOT DETECTED NOT DETECTED Final   Norovirus GI/GII NOT DETECTED NOT DETECTED Final   Rotavirus A NOT DETECTED NOT DETECTED Final   Sapovirus (I, II, IV, and V) NOT DETECTED NOT DETECTED Final    Comment: Performed at [facility], [address] [place], [place] [address]  C Difficile Quick Screen w PCR reflex     Status: None   Collection Time: 01/25/24  9:42 AM   Specimen: STOOL  Result Value Ref Range Status   C Diff antigen NEGATIVE NEGATIVE Final   C Diff toxin NEGATIVE NEGATIVE Final   C Diff interpretation No C. difficile detected.  Final    Comment: Performed at [facility], [address] [place], [place] [address]  MRSA Next Gen by PCR, Nasal     Status: None   Collection Time: 01/27/24  9:31 AM   Specimen: Nasal Mucosa; Nasal Swab  Result Value Ref Range Status   MRSA by PCR Next Gen NOT DETECTED NOT DETECTED Final    Comment: (NOTE) The GeneXpert MRSA Assay (FDA approved for NASAL specimens only), is one component of a comprehensive MRSA colonization surveillance program. It is not intended to diagnose MRSA infection nor to guide or monitor treatment for MRSA infections. Test performance is not FDA approved in patients less than 80 years old. Performed at [facility], [address] [place], [place] [address]     Radiology Studies: No results found.    Brynnan Rodenbaugh T. Loriel Diehl [facility]  If 7PM-7AM, please contact night-coverage [email or web address] 02/03/2024, 9:36 AM

## 2024-02-03 NOTE — Plan of Care (Signed)
   Problem: Education: Goal: Knowledge of General Education information will improve Description Including pain rating scale, medication(s)/side effects and non-pharmacologic comfort measures Outcome: Progressing

## 2024-02-04 DIAGNOSIS — J101 Influenza due to other identified influenza virus with other respiratory manifestations: Secondary | ICD-10-CM | POA: Diagnosis not present

## 2024-02-04 DIAGNOSIS — E871 Hypo-osmolality and hyponatremia: Secondary | ICD-10-CM | POA: Diagnosis not present

## 2024-02-04 DIAGNOSIS — M79605 Pain in left leg: Secondary | ICD-10-CM | POA: Diagnosis not present

## 2024-02-04 DIAGNOSIS — J189 Pneumonia, unspecified organism: Secondary | ICD-10-CM | POA: Diagnosis not present

## 2024-02-04 LAB — IRON AND TIBC
Iron: 48 ug/dL (ref 45–182)
Saturation Ratios: 18 % (ref 17.9–39.5)
TIBC: 273 ug/dL (ref 250–450)
UIBC: 225 ug/dL

## 2024-02-04 LAB — RENAL FUNCTION PANEL
Albumin: 2.4 g/dL — ABNORMAL LOW (ref 3.5–5.0)
Anion gap: 9 (ref 5–15)
BUN: 13 mg/dL (ref 6–20)
CO2: 26 mmol/L (ref 22–32)
Calcium: 8.3 mg/dL — ABNORMAL LOW (ref 8.9–10.3)
Chloride: 99 mmol/L (ref 98–111)
Creatinine, Ser: 0.65 mg/dL (ref 0.61–1.24)
GFR, Estimated: >60 mL/min (ref >60)
Glucose, Bld: 96 mg/dL (ref 70–99)
Phosphorus: 3.5 mg/dL (ref 2.5–4.6)
Potassium: 4.5 mmol/L (ref 3.5–5.1)
Sodium: 134 mmol/L — ABNORMAL LOW (ref 135–145)

## 2024-02-04 LAB — VITAMIN B12: Vitamin B-12: 291 pg/mL (ref 180–914)

## 2024-02-04 LAB — CBC
HCT: 37.7 % — ABNORMAL LOW (ref 39.0–52.0)
Hemoglobin: 13 g/dL (ref 13.0–17.0)
MCH: 35.2 pg — ABNORMAL HIGH (ref 26.0–34.0)
MCHC: 34.5 g/dL (ref 30.0–36.0)
MCV: 102.2 fL — ABNORMAL HIGH (ref 80.0–100.0)
Platelets: 345 10*3/uL (ref 150–400)
RBC: 3.69 MIL/uL — ABNORMAL LOW (ref 4.22–5.81)
RDW: 12.1 % (ref 11.5–15.5)
WBC: 7.3 10*3/uL (ref 4.0–10.5)
nRBC: 0 % (ref 0.0–0.2)

## 2024-02-04 LAB — MAGNESIUM: Magnesium: 2 mg/dL (ref 1.7–2.4)

## 2024-02-04 LAB — FOLATE: Folate: 13.6 ng/mL (ref >5.9)

## 2024-02-04 LAB — RETICULOCYTES
Immature Retic Fract: 10.2 % (ref 2.3–15.9)
RBC.: 3.7 MIL/uL — ABNORMAL LOW (ref 4.22–5.81)
Retic Count, Absolute: 44.4 10*3/uL (ref 19.0–186.0)
Retic Ct Pct: 1.2 % (ref 0.4–3.1)

## 2024-02-04 LAB — FERRITIN: Ferritin: 709 ng/mL — ABNORMAL HIGH (ref 24–336)

## 2024-02-04 NOTE — TOC Progression Note (Signed)
 Transition of Care [facility]) - Progression Note    Patient Details  Name: BLAYDEN CONWELL MRN: [id number] Date of Birth: 1966-07-04  Transition of [facility]) CM/SW Contact  Marliss Coots, LCSW Phone Number: 02/04/2024, 1:57 PM  Clinical Narrative:     1:57 PM Bedside RN informed CSW during progressions that patient is "done with alcohol." Patient stated that he "does not need any help." CSW closed TOC consult.  Expected Discharge Plan: Home/Self Care Barriers to Discharge: Continued Medical Work up  Expected Discharge Plan and Services In-house Referral: Clinical Social Work     Living arrangements for the past 2 months: Single Family Home                                       Social Determinants of Health (SDOH) Interventions SDOH Screenings   Food Insecurity: No Food Insecurity (01/25/2024)  Housing: Low Risk  (02/03/2024)  Transportation Needs: No Transportation Needs (01/25/2024)  Utilities: Not At Risk (01/25/2024)  Tobacco Use: High Risk (01/24/2024)    Readmission Risk Interventions    01/28/2024    2:10 PM  Readmission Risk Prevention Plan  Transportation Screening Complete  PCP or Specialist Appt within 5-7 Days Complete  Home Care Screening Complete  Medication Review (RN CM) Referral to Pharmacy

## 2024-02-04 NOTE — Evaluation (Signed)
 Physical Therapy Evaluation Patient Details Name: Xavier [name] MRN: [id number] DOB: [birth year] Today's Date: 02/04/2024  History of Present Illness  Xavier [name] is a 58 y.o. male who presented to ED with c/o myalgia, fever, productive cough, shortness of breath and dyspnea. Pt flu A + and PNA. PMH: chronic smoker and alcohol use, L LE sciatica   Clinical Impression  Pt admitted with above. PTA pt was indep, living alone, working, did not use AD or supplemental O2.  Pt currently presenting with SOB with activity, decreased activity tolerance, and requires 6lo2 to maintain SPO2 > 88% during activity. Pt requiring freq rest breaks during activity. Pt amb to bathroom and then to sink but needed to sit prior to trying to amb. Educated on seated LE AROM to aide in building strength and endurance. Anticipate pt to progress well during hospital stay and will not need follow up PT upon d/c. Acute PT to cont to follow.      If plan is discharge home, recommend the following:     Can travel by private vehicle        Equipment Recommendations None recommended by PT  Recommendations for Other Services       Functional Status Assessment Patient has had a recent decline in their functional status and demonstrates the ability to make significant improvements in function in a reasonable and predictable amount of time.     Precautions / Restrictions Precautions Precautions: Fall Precaution/Restrictions Comments: droplet Restrictions Weight Bearing Restrictions Per Provider Order: No      Mobility  Bed Mobility Overal bed mobility: Modified Independent             General bed mobility comments: HOB slightly elevated, no use of bed rails, quick to move    Transfers Overall transfer level: Needs assistance Equipment used: None Transfers: Sit to/from Stand Sit to Stand: Contact guard assist           General transfer comment: contact guard for safety and line managment     Ambulation/Gait Ambulation/Gait assistance: Contact guard assist Gait Distance (Feet): 100 Feet Assistive device: None Gait Pattern/deviations: Decreased stride length, Step-through pattern Gait velocity: dec Gait velocity interpretation: <1.31 ft/sec, indicative of household ambulator   General Gait Details: guarded, cautious, reaching for hallway rail occasionally, noted SOB, SpO2 at 88% on 6Lo2 via [facility], 3 standing rest breaks  Stairs            Wheelchair Mobility     Tilt Bed    Modified Rankin (Stroke Patients Only)       Balance Overall balance assessment: Mild deficits observed, not formally tested                                           Pertinent Vitals/Pain Pain Assessment Pain Assessment: Faces Faces Pain Scale: Hurts little more Pain Location: L LE discomfort from sciatica Pain Descriptors / Indicators: Aching Pain Intervention(s): Monitored during session    Home Living Family/patient expects to be discharged to:: Private residence Living Arrangements: Alone Available Help at Discharge:  (reports zero help) Type of Home: House Home Access: Stairs to enter (1 step to enter) Entrance Stairs-Rails: None     Home Layout: One level Home Equipment: None      Prior Function Prior Level of Function : Independent/Modified Independent;Working/employed;Driving  Mobility Comments: no AD ADLs Comments: indep     Extremity/Trunk Assessment   Upper Extremity Assessment Upper Extremity Assessment: Overall WFL for tasks assessed    Lower Extremity Assessment Lower Extremity Assessment: Overall WFL for tasks assessed    Cervical / Trunk Assessment Cervical / Trunk Assessment: Normal  Communication   Communication Communication: No apparent difficulties    Cognition Arousal: Alert Behavior During Therapy: Anxious                           PT - Cognition Comments: pt very anxious reporting "I'm a  worrier." pt able to follow commands, accepting of advice/recommendations. Following commands: Intact       Cueing Cueing Techniques: Verbal cues     General Comments General comments (skin integrity, edema, etc.): SpO2 dropped to 86% on 4Lo2 via [place] while trying to wash face    Exercises     Assessment/Plan    PT Assessment Patient needs continued PT services  PT Problem List Decreased strength;Decreased activity tolerance;Decreased balance;Decreased mobility       PT Treatment Interventions DME instruction;Gait training;Functional mobility training;Therapeutic activities;Therapeutic exercise;Balance training    PT Goals (Current goals can be found in the Care Plan section)  Acute Rehab PT Goals Patient Stated Goal: home without O2 PT Goal Formulation: With patient Time For Goal Achievement: 02/18/24 Potential to Achieve Goals: Good    Frequency Min 1X/week     Co-evaluation               AM-PAC PT "6 Clicks" Mobility  Outcome Measure Help needed turning from your back to your side while in a flat bed without using bedrails?: None Help needed moving from lying on your back to sitting on the side of a flat bed without using bedrails?: None Help needed moving to and from a bed to a chair (including a wheelchair)?: A Little Help needed standing up from a chair using your arms (e.g., wheelchair or bedside chair)?: A Little Help needed to walk in hospital room?: A Little Help needed climbing 3-5 steps with a railing? : A Lot 6 Click Score: 19    End of Session Equipment Utilized During Treatment: Oxygen;Gait belt Activity Tolerance: Patient limited by fatigue Patient left: in bed;with call bell/phone within reach Nurse Communication: Mobility status (SpO2 sats) PT Visit Diagnosis: Unsteadiness on feet (R26.81);Muscle weakness (generalized) (M62.81);Difficulty in walking, not elsewhere classified (R26.2)    Time: 1610-9604 PT Time Calculation (min) (ACUTE ONLY):  25 min   Charges:   PT Evaluation $PT Eval Low Complexity: 1 Low PT Treatments $Gait Training: 8-22 mins PT General Charges $$ ACUTE PT VISIT: 1 Visit         Lewis Shock, PT, DPT Acute Rehabilitation Services Secure chat preferred Office #: [phone]   Iona Hansen 02/04/2024, 12:46 PM

## 2024-02-04 NOTE — Progress Notes (Signed)
 PROGRESS NOTE  Xavier [name] WUJ:[id number] DOB: May 31, 1966   PCP: Jac Canavan, PA-C  Patient is from: Home.  DOA: 01/24/2024 LOS: 11  Chief complaints Chief Complaint  Patient presents with   Leg Pain   Shortness of Breath     Brief Narrative / Interim history: 58 year old M with PMH of CVA, CHF, ACID, CAD, asthma, DM-2, OSA, HTN, tobacco use disorder and alcohol use disorder presented to ED with shortness of breath and leg pain, and admitted with working diagnosis of acute respiratory failure with hypoxia due to multifocal pneumonia, influenza A infection and A-fib with RVR.  CXR with right upper lobe infiltrate.  CT angio chest negative for PE but multifocal and confluent areas of groundglass opacity throughout both lungs.  Patient was initially admitted to ICU and required BiPAP.  Eventually, weaned to [facility] and transferred to hospitalist service.  Completed antibiotic course.  Oxygen requirement improved.  Currently on 5 L by [place].  Subjective: Seen and examined earlier this morning.  No major events overnight of this morning.  No complaints.  Oxygen requirement improved.  Currently saturating in 90s on 5 L by [place].  Objective: Vitals:   02/04/24 0909 02/04/24 1015 02/04/24 1127 02/04/24 1203  BP:   (!) 120/95   Pulse: 70  74   Resp: 18  20   Temp:   98.2 F (36.8 C)   TempSrc:   Oral   SpO2: 95% 92% 91% 92%  Weight:        Examination:  GENERAL: No apparent distress.  Nontoxic. HEENT: MMM.  Vision and hearing grossly intact.  NECK: Supple.  No apparent JVD.  RESP:  No IWOB.  Fair aeration bilaterally.  Some rhonchi bilaterally. CVS:  RRR. Heart sounds normal.  ABD/GI/GU: BS+. Abd soft, NTND.  MSK/EXT:  Moves extremities. No apparent deformity. No edema.  SKIN: Scaly red skin rash with clear demarcation on his buttock and posterior thigh NEURO: Awake, alert and oriented appropriately.  No apparent focal neuro deficit. PSYCH: Calm. Normal affect.   Procedures:   None  Microbiology summarized: 2/7-influenza A PCR positive 2/7-COVID-19, influenza B and RSV PCR nonreactive 2/7-blood cultures NGTD 2/8-GIP and C. difficile negative. 2/10-MRSA PCR nonreactive    Assessment and plan: Acute hypoxic respiratory failure due to due to multifocal community-acquired pneumonia and influenza A infection: Initially required BiPAP.  CT angio chest negative for PE but multifocal pneumonia in both lungs.  Microbiology as above.  Procalcitonin negative.  Oxygen requirement improved.  Currently saturating at lower 90s on 5 L by [place]. -Completed course for Tamiflu and steroid. -CTX 2/7-2/10.  Zosyn 2/12-2/16 -Zithromax 2/7>> doxycycline 2/8-2/12. -Continue scheduled and as needed nebulizers. -Continue weaning oxygen -Incentive spirometry, OOB/PT/OT -He may need oxygen on discharge  Acute alcohol withdrawal: Reports drinking about 6 beers a day.  Denies major withdrawal symptoms in the past.  She would be outside withdrawal window now.  Completed phenobarbital taper -Continue multivitamin, thiamine and folic acid -Encouraged alcohol cessation   Paroxysmal A-fib with RVR: Short-lived.  Converted to NSR.  Likely due to alcohol and respiratory failure.  -Encourage alcohol cessation  Elevated troponin: Likely demand ischemia in the setting of the above.  TTE without significant finding.  Skin rash on bilateral buttock and left upper posterior thigh: Concerning for tinea corporis -Change miconazole to terbinafine cream -Discontinue probiotic.   Tobacco use disorder: Reports smoking 10 to 15 cigarettes a day. -Encouraged cessation.   Left-sided leg pain: Venous Doppler negative for DVT. -Pain  control   Elevated LFTs: Likely due to alcohol.  Resolved.   Diarrhea: Seems to have resolved.  GI PCR and C. difficile negative. -Discontinue probiotic given possible fungal infection of skin  Hypokalemia -Monitor replenish as appropriate  Thrombocytopenia:  Resolved.  AKI ruled out.   Hyponatremia: Resolved    Body mass index is 24.86 kg/m.           DVT prophylaxis:  enoxaparin (LOVENOX) injection 40 mg Start: 01/25/24 1000 SCDs Start: 01/24/24 1817 Place TED hose Start: 01/24/24 1817  Code Status: Full code Family Communication: None at bedside Level of care: Progressive Status is: Inpatient Remains inpatient appropriate because: Acute respiratory failure with hypoxia   Final disposition: Home Consultants:  Pulmonology admitted patient  55 minutes with more than 50% spent in reviewing records, counseling patient/family and coordinating care.   Sch Meds:  Scheduled Meds:  arformoterol  15 mcg Nebulization BID   budesonide (PULMICORT) nebulizer solution  0.5 mg Nebulization BID   enoxaparin (LOVENOX) injection  40 mg Subcutaneous Q24H   folic acid  1 mg Oral Daily   gabapentin  300 mg Oral TID   multivitamin with minerals  1 tablet Oral Daily   revefenacin  175 mcg Nebulization Daily   sodium chloride flush  3 mL Intravenous Q12H   terbinafine   Topical BID   thiamine  100 mg Oral Daily   Continuous Infusions:   PRN Meds:.acetaminophen **OR** acetaminophen, hydrOXYzine, ipratropium-albuterol, metoprolol tartrate, morphine injection, ondansetron **OR** ondansetron (ZOFRAN) IV, mouth rinse, oxyCODONE, sodium chloride flush  Antimicrobials: Anti-infectives (From admission, onward)    Start     Dose/Rate Route Frequency Ordered Stop   01/31/24 1200  fluconazole (DIFLUCAN) tablet 100 mg  Status:  Discontinued        100 mg Oral Daily 01/31/24 1109 02/01/24 1320   01/29/24 1400  piperacillin-tazobactam (ZOSYN) IVPB 3.375 g        3.375 g 12.5 mL/hr over 240 Minutes Intravenous Every 8 hours 01/29/24 0948 02/02/24 0115   01/27/24 1945  doxycycline (VIBRAMYCIN) 100 mg in sodium chloride 0.9 % 250 mL IVPB  Status:  Discontinued        100 mg 125 mL/hr over 120 Minutes Intravenous Every 12 hours 01/27/24 1856  01/29/24 0948   01/27/24 1000  piperacillin-tazobactam (ZOSYN) IVPB 3.375 g  Status:  Discontinued        3.375 g 12.5 mL/hr over 240 Minutes Intravenous Every 8 hours 01/27/24 0929 01/29/24 0948   01/26/24 2200  doxycycline (VIBRA-TABS) tablet 100 mg  Status:  Discontinued        100 mg Oral Every 12 hours 01/26/24 1519 01/27/24 1856   01/25/24 1000  cefTRIAXone (ROCEPHIN) 2 g in sodium chloride 0.9 % 100 mL IVPB  Status:  Discontinued        2 g 200 mL/hr over 30 Minutes Intravenous Every 24 hours 01/24/24 1816 01/27/24 0929   01/25/24 1000  doxycycline (VIBRAMYCIN) 100 mg in sodium chloride 0.9 % 250 mL IVPB  Status:  Discontinued        100 mg 125 mL/hr over 120 Minutes Intravenous Every 12 hours 01/24/24 1821 01/26/24 1519   01/24/24 2200  oseltamivir (TAMIFLU) capsule 75 mg  Status:  Discontinued        75 mg Oral 2 times daily 01/24/24 1825 01/29/24 1104   01/24/24 1830  doxycycline (VIBRAMYCIN) 100 mg in sodium chloride 0.9 % 250 mL IVPB  Status:  Discontinued  100 mg 125 mL/hr over 120 Minutes Intravenous Every 12 hours 01/24/24 1816 01/24/24 1821   01/24/24 1800  cefTRIAXone (ROCEPHIN) 1 g in sodium chloride 0.9 % 100 mL IVPB        1 g 200 mL/hr over 30 Minutes Intravenous  Once 01/24/24 1753 01/24/24 2126   01/24/24 1800  azithromycin (ZITHROMAX) 500 mg in sodium chloride 0.9 % 250 mL IVPB        500 mg 250 mL/hr over 60 Minutes Intravenous  Once 01/24/24 1753 01/24/24 2227        I have personally reviewed the following labs and images: CBC: Recent Labs  Lab 01/30/24 0300 01/31/24 0410 02/01/24 0344 02/02/24 0426 02/04/24 0220  WBC 9.0 8.7 7.3 7.8 7.3  HGB 11.5* 12.2* 12.4* 12.8* 13.0  HCT 33.2* 35.3* 36.1* 37.3* 37.7*  MCV 101.5* 102.0* 101.4* 102.5* 102.2*  PLT 237 254 274 291 345   BMP &GFR Recent Labs  Lab 01/29/24 0413 01/30/24 0300 01/31/24 0410 02/01/24 0344 02/02/24 0426 02/04/24 0220  NA 139 135 135 135 135 134*  K 3.9 4.0 3.8 4.2 4.3  4.5  CL 98 101 97* 97* 97* 99  CO2 25 26 27 29 28 26   GLUCOSE 124* 118* 87 90 93 96  BUN 17 18 16 14 12 13   CREATININE 0.61 0.68 0.68 0.69 0.71 0.65  CALCIUM 8.6* 8.1* 8.0* 8.1* 8.5* 8.3*  MG 2.3  --  1.9 2.1 2.0 2.0  PHOS 3.3  --   --   --  3.5 3.5   CrCl cannot be calculated (Unknown ideal weight.). Liver & Pancreas: Recent Labs  Lab 01/29/24 0413 01/31/24 0410 02/02/24 0426 02/04/24 0220  AST 33 27  --   --   ALT 31 44  --   --   ALKPHOS 62 49  --   --   BILITOT 0.8 0.9  --   --   PROT 6.0* 5.5*  --   --   ALBUMIN 2.2* 2.2* 2.3* 2.4*   No results for input(s): "LIPASE", "AMYLASE" in the last 168 hours. No results for input(s): "AMMONIA" in the last 168 hours. Diabetic: No results for input(s): "HGBA1C" in the last 72 hours. No results for input(s): "GLUCAP" in the last 168 hours.  Cardiac Enzymes: No results for input(s): "CKTOTAL", "CKMB", "CKMBINDEX", "TROPONINI" in the last 168 hours. No results for input(s): "PROBNP" in the last 8760 hours. Coagulation Profile: No results for input(s): "INR", "PROTIME" in the last 168 hours. Thyroid Function Tests: No results for input(s): "TSH", "T4TOTAL", "FREET4", "T3FREE", "THYROIDAB" in the last 72 hours. Lipid Profile: No results for input(s): "CHOL", "HDL", "LDLCALC", "TRIG", "CHOLHDL", "LDLDIRECT" in the last 72 hours. Anemia Panel: Recent Labs    02/04/24 0220  VITAMINB12 291  FOLATE 13.6  FERRITIN 709*  TIBC 273  IRON 48  RETICCTPCT 1.2   Urine analysis:    Component Value Date/Time   COLORURINE YELLOW 01/25/2024 0209   APPEARANCEUR CLEAR 01/25/2024 0209   LABSPEC 1.032 (H) 01/25/2024 0209   PHURINE 5.0 01/25/2024 0209   GLUCOSEU NEGATIVE 01/25/2024 0209   HGBUR MODERATE (A) 01/25/2024 0209   BILIRUBINUR NEGATIVE 01/25/2024 0209   KETONESUR NEGATIVE 01/25/2024 0209   PROTEINUR 100 (A) 01/25/2024 0209   NITRITE NEGATIVE 01/25/2024 0209   LEUKOCYTESUR NEGATIVE 01/25/2024 0209   Sepsis Labs: Invalid  input(s): "PROCALCITONIN", "LACTICIDVEN"  Microbiology: Recent Results (from the past 240 hours)  MRSA Next Gen by PCR, Nasal     Status: None  Collection Time: 01/27/24  9:31 AM   Specimen: Nasal Mucosa; Nasal Swab  Result Value Ref Range Status   MRSA by PCR Next Gen NOT DETECTED NOT DETECTED Final    Comment: (NOTE) The GeneXpert MRSA Assay (FDA approved for NASAL specimens only), is one component of a comprehensive MRSA colonization surveillance program. It is not intended to diagnose MRSA infection nor to guide or monitor treatment for MRSA infections. Test performance is not FDA approved in patients less than 55 years old. Performed at [facility], [address] [place], [place] [address]     Radiology Studies: No results found.    Xavier [name] T. Gloria Lambertson [facility]  If 7PM-7AM, please contact night-coverage [email or web address] 02/04/2024, 12:37 PM

## 2024-02-04 NOTE — Plan of Care (Signed)

## 2024-02-04 NOTE — Evaluation (Addendum)
 Occupational Therapy Evaluation Patient Details Name: Xavier [name] MRN: [id number] DOB: [birth year] Today's Date: 02/04/2024   History of Present Illness   Xavier [name] is a 58 y.o. male who presented to ED with c/o myalgia, fever, productive cough, shortness of breath and dyspnea. Pt flu A + and PNA. PMH: chronic smoker and alcohol use, L LE sciatica     Clinical Impressions Pt c/o SOB, pain to L leg, reports has sciatica. Pt lives alone, no assistance at home, PLOF independent. Pt currently presents with decreased activity tolerance and SOB, desats to 88% O2 with OOB activity on 6L O2 via [place], quickly recovers with standing rest breaks. Attempted to lower supplemental O2 to RA, steady at 88/89% until 5L O2 via  supplemented while resting. At this time Pt requires O2 supplementation to maintain O2 levels during activity and at rest. Pt anxious about returning home too soon, spent a considerable amount of time explaining safety awareness, use of supplemental O2 in home setting, compensatory strategies for completing ADL/IADLs once home, energy conservation strategies, and testing O2 levels with supp O2. No follow up OT expected, will continue to see acutely to maximize activity tolerance. No DME needs expected, did speak to Pt about rollator, I do not expect he will need one.      If plan is discharge home, recommend the following:   A little help with walking and/or transfers;Assistance with cooking/housework     Functional Status Assessment   Patient has had a recent decline in their functional status and demonstrates the ability to make significant improvements in function in a reasonable and predictable amount of time.     Equipment Recommendations   None recommended by OT     Recommendations for Other Services         Precautions/Restrictions   Precautions Precautions: Fall Precaution/Restrictions Comments: droplet Restrictions Weight Bearing Restrictions Per  Provider Order: No     Mobility Bed Mobility Overal bed mobility: Modified Independent                  Transfers Overall transfer level: Needs assistance Equipment used: None Transfers: Sit to/from Stand Sit to Stand: Supervision           General transfer comment: supervision for safety, no LOB      Balance Overall balance assessment: Mild deficits observed, not formally tested                                         ADL either performed or assessed with clinical judgement   ADL Overall ADL's : Modified independent                                       General ADL Comments: mod I for increased rest breaks, SOB with activity     Vision Baseline Vision/History: 1 Wears glasses Ability to See in Adequate Light: 0 Adequate Patient Visual Report: No change from baseline       Perception         Praxis         Pertinent Vitals/Pain Pain Assessment Pain Assessment: Faces Faces Pain Scale: Hurts little more Pain Location: L leg, reports sciatica Pain Descriptors / Indicators: Aching Pain Intervention(s): Monitored during session     Extremity/Trunk Assessment Upper Extremity Assessment Upper  Extremity Assessment: Overall WFL for tasks assessed   Lower Extremity Assessment Lower Extremity Assessment: Defer to PT evaluation   Cervical / Trunk Assessment Cervical / Trunk Assessment: Normal   Communication Communication Communication: No apparent difficulties   Cognition Arousal: Alert Behavior During Therapy: Anxious Cognition: No apparent impairments             OT - Cognition Comments: Pt anxious about going home too soon and getting SOB or passing out                 Following commands: Intact       Cueing  General Comments          Exercises     Shoulder Instructions      Home Living Family/patient expects to be discharged to:: Private residence Living Arrangements:  Alone Available Help at Discharge:  (reports zero help) Type of Home: House Home Access: Stairs to enter [employer] of Steps: 1 Entrance Stairs-Rails: None Home Layout: One level     Bathroom Shower/Tub: Chief Strategy Officer: Standard     Home Equipment: None          Prior Functioning/Environment Prior Level of Function : Independent/Modified Independent;Working/employed;Driving             Mobility Comments: no AD ADLs Comments: indep    OT Problem List: Decreased activity tolerance;Cardiopulmonary status limiting activity;Pain   OT Treatment/Interventions: Self-care/ADL training;Therapeutic exercise;Energy conservation;DME and/or AE instruction;Therapeutic activities;Patient/family education      OT Goals(Current goals can be found in the care plan section)   Acute Rehab OT Goals Patient Stated Goal: to improve activity tolerance OT Goal Formulation: With patient Time For Goal Achievement: 02/18/24 Potential to Achieve Goals: Good   OT Frequency:  Min 1X/week    Co-evaluation              AM-PAC OT "6 Clicks" Daily Activity     Outcome Measure Help from another person eating meals?: None Help from another person taking care of personal grooming?: None Help from another person toileting, which includes using toliet, bedpan, or urinal?: None Help from another person bathing (including washing, rinsing, drying)?: None Help from another person to put on and taking off regular upper body clothing?: None Help from another person to put on and taking off regular lower body clothing?: None 6 Click Score: 24   End of Session Equipment Utilized During Treatment: Gait belt Nurse Communication: Mobility status  Activity Tolerance: Patient tolerated treatment well Patient left: in bed;with call bell/phone within reach  OT Visit Diagnosis: Other (comment);Pain;Muscle weakness (generalized) (M62.81) (decreased activity  tolerance) Pain - Right/Left: Left Pain - part of body: Leg                Time: 4098-1191 OT Time Calculation (min): 46 min Charges:  OT General Charges $OT Visit: 1 Visit OT Evaluation $OT Eval Low Complexity: 1 Low OT Treatments $Self Care/Home Management : 8-22 mins $Therapeutic Activity: 8-22 mins  Vashon Arch, OTR/L   Stephens Shreve R Yomar Mejorado 02/04/2024, 1:10 PM

## 2024-02-05 DIAGNOSIS — J9601 Acute respiratory failure with hypoxia: Secondary | ICD-10-CM | POA: Diagnosis not present

## 2024-02-05 DIAGNOSIS — R7989 Other specified abnormal findings of blood chemistry: Secondary | ICD-10-CM | POA: Diagnosis not present

## 2024-02-05 DIAGNOSIS — E876 Hypokalemia: Secondary | ICD-10-CM | POA: Diagnosis not present

## 2024-02-05 DIAGNOSIS — J189 Pneumonia, unspecified organism: Secondary | ICD-10-CM | POA: Diagnosis not present

## 2024-02-05 NOTE — Progress Notes (Signed)
 Mobility Specialist Progress Note:  Nurse requested Mobility Specialist to perform oxygen saturation test with pt which includes removing pt from oxygen both at rest and while ambulating.  Below are the results from that testing.     Patient Saturations on Room Air at Rest = spO2 83% Needing O2 flow increased to 4L/min to keep SPO2 above 88%  Patient Saturations on Room Air while Ambulating = sp02 N/A .    Patient Saturations on 6 Liters of oxygen while Ambulating = sp02 92%  At end of testing pt left in room on 4  Liters of oxygen.  Reported results to nurse.    Thompson Grayer Mobility Specialist  Please contact vis Secure Chat or  Rehab Office [phone]

## 2024-02-05 NOTE — Progress Notes (Signed)
 PROGRESS NOTE  Xavier [name] XBJ:[id number] DOB: 01-07-1966   PCP: Jac Canavan, PA-C  Patient is from: Home.  DOA: 01/24/2024 LOS: 12  Chief complaints Chief Complaint  Patient presents with   Leg Pain   Shortness of Breath     Brief Narrative / Interim history: 58 year old M with PMH of CVA, CHF, ACID, CAD, asthma, DM-2, OSA, HTN, tobacco use disorder and alcohol use disorder presented to ED with shortness of breath and leg pain, and admitted with working diagnosis of acute respiratory failure with hypoxia due to multifocal pneumonia, influenza A infection and A-fib with RVR.  CXR with right upper lobe infiltrate.  CT angio chest negative for PE but multifocal and confluent areas of groundglass opacity throughout both lungs.  Patient was initially admitted to ICU and required BiPAP.  Eventually, weaned to [facility] and transferred to hospitalist service.  Completed antibiotic course.  Oxygen requirement improved but required 6 L with ambulation.   Subjective: Seen and examined earlier this morning.  No major events overnight of this morning.  No major complaints other than some discomfort around his lower chest when he woke up this morning.  Ambulated but required 6 L to maintain appropriate saturation.  Also felt short of breath.  Objective: Vitals:   02/04/24 2045 02/04/24 2314 02/05/24 0451 02/05/24 0734  BP:  118/75 111/66 118/73  Pulse:  83 72   Resp:  20 17   Temp:  99 F (37.2 C) 98.7 F (37.1 C) 98.3 F (36.8 C)  TempSrc:  Oral Oral Oral  SpO2: 95% 92% 95%   Weight:      Height:        Examination:  GENERAL: No apparent distress.  Nontoxic. HEENT: MMM.  Vision and hearing grossly intact.  NECK: Supple.  No apparent JVD.  RESP:  No IWOB.  Fair aeration bilaterally.  Some rhonchi bilaterally. CVS:  RRR. Heart sounds normal.  ABD/GI/GU: BS+. Abd soft, NTND.  MSK/EXT:  Moves extremities. No apparent deformity. No edema.  SKIN: Scaly red skin rash with clear  demarcation on his buttock and posterior thigh NEURO: Awake, alert and oriented appropriately.  No apparent focal neuro deficit. PSYCH: Calm. Normal affect.   Procedures:  None  Microbiology summarized: 2/7-influenza A PCR positive 2/7-COVID-19, influenza B and RSV PCR nonreactive 2/7-blood cultures NGTD 2/8-GIP and C. difficile negative. 2/10-MRSA PCR nonreactive    Assessment and plan: Acute hypoxic respiratory failure due to due to multifocal community-acquired pneumonia and influenza A infection: Initially required BiPAP.  CT angio chest negative for PE but multifocal pneumonia in both lungs.  Microbiology as above.  Procalcitonin negative.  Oxygen requirement improved but required 6 L with ambulation. -Completed course for Tamiflu and steroid. -CTX 2/7-2/10.  Zosyn 2/12-2/16 -Zithromax 2/7>> doxycycline 2/8-2/12. -Continue scheduled and as needed nebulizers. -Continue weaning oxygen -Incentive spirometry, OOB/PT/OT -He may need oxygen on discharge  Acute alcohol withdrawal: Reports drinking about 6 beers a day.  Denies major withdrawal symptoms in the past.  She would be outside withdrawal window now.  Completed phenobarbital taper -Continue multivitamin, thiamine and folic acid -Encouraged alcohol cessation   Paroxysmal A-fib with RVR: Short-lived.  Converted to NSR.  Likely due to alcohol and respiratory failure.  -Encourage alcohol cessation  Elevated troponin: Likely demand ischemia in the setting of the above.  TTE without significant finding.  Skin rash on bilateral buttock and left upper posterior thigh: Concerning for tinea corporis -Change miconazole to terbinafine cream -Discontinue probiotic.   Tobacco  use disorder: Reports smoking 10 to 15 cigarettes a day. -Encouraged cessation.   Left-sided leg pain: Venous Doppler negative for DVT. -Pain control   Elevated LFTs: Likely due to alcohol.  Resolved.   Diarrhea: Seems to have resolved.  GI PCR and C.  difficile negative. -Discontinue probiotic given possible fungal infection of skin  Hypokalemia -Monitor replenish as appropriate  Thrombocytopenia: Resolved.  AKI ruled out.   Hyponatremia: Resolved    Body mass index is 24.86 kg/m.           DVT prophylaxis:  enoxaparin (LOVENOX) injection 40 mg Start: 01/25/24 1000 SCDs Start: 01/24/24 1817 Place TED hose Start: 01/24/24 1817  Code Status: Full code Family Communication: None at bedside Level of care: Telemetry Medical Status is: Inpatient Remains inpatient appropriate because: Acute respiratory failure with significant oxygen requirements   Final disposition: Home Consultants:  Pulmonology admitted patient  35 minutes with more than 50% spent in reviewing records, counseling patient/family and coordinating care.   Sch Meds:  Scheduled Meds:  arformoterol  15 mcg Nebulization BID   budesonide (PULMICORT) nebulizer solution  0.5 mg Nebulization BID   enoxaparin (LOVENOX) injection  40 mg Subcutaneous Q24H   folic acid  1 mg Oral Daily   gabapentin  300 mg Oral TID   multivitamin with minerals  1 tablet Oral Daily   revefenacin  175 mcg Nebulization Daily   sodium chloride flush  3 mL Intravenous Q12H   terbinafine   Topical BID   thiamine  100 mg Oral Daily   Continuous Infusions:   PRN Meds:.acetaminophen **OR** acetaminophen, hydrOXYzine, ipratropium-albuterol, metoprolol tartrate, morphine injection, ondansetron **OR** ondansetron (ZOFRAN) IV, mouth rinse, oxyCODONE, sodium chloride flush  Antimicrobials: Anti-infectives (From admission, onward)    Start     Dose/Rate Route Frequency Ordered Stop   01/31/24 1200  fluconazole (DIFLUCAN) tablet 100 mg  Status:  Discontinued        100 mg Oral Daily 01/31/24 1109 02/01/24 1320   01/29/24 1400  piperacillin-tazobactam (ZOSYN) IVPB 3.375 g        3.375 g 12.5 mL/hr over 240 Minutes Intravenous Every 8 hours 01/29/24 0948 02/02/24 0115   01/27/24 1945   doxycycline (VIBRAMYCIN) 100 mg in sodium chloride 0.9 % 250 mL IVPB  Status:  Discontinued        100 mg 125 mL/hr over 120 Minutes Intravenous Every 12 hours 01/27/24 1856 01/29/24 0948   01/27/24 1000  piperacillin-tazobactam (ZOSYN) IVPB 3.375 g  Status:  Discontinued        3.375 g 12.5 mL/hr over 240 Minutes Intravenous Every 8 hours 01/27/24 0929 01/29/24 0948   01/26/24 2200  doxycycline (VIBRA-TABS) tablet 100 mg  Status:  Discontinued        100 mg Oral Every 12 hours 01/26/24 1519 01/27/24 1856   01/25/24 1000  cefTRIAXone (ROCEPHIN) 2 g in sodium chloride 0.9 % 100 mL IVPB  Status:  Discontinued        2 g 200 mL/hr over 30 Minutes Intravenous Every 24 hours 01/24/24 1816 01/27/24 0929   01/25/24 1000  doxycycline (VIBRAMYCIN) 100 mg in sodium chloride 0.9 % 250 mL IVPB  Status:  Discontinued        100 mg 125 mL/hr over 120 Minutes Intravenous Every 12 hours 01/24/24 1821 01/26/24 1519   01/24/24 2200  oseltamivir (TAMIFLU) capsule 75 mg  Status:  Discontinued        75 mg Oral 2 times daily 01/24/24 1825 01/29/24 1104  01/24/24 1830  doxycycline (VIBRAMYCIN) 100 mg in sodium chloride 0.9 % 250 mL IVPB  Status:  Discontinued        100 mg 125 mL/hr over 120 Minutes Intravenous Every 12 hours 01/24/24 1816 01/24/24 1821   01/24/24 1800  cefTRIAXone (ROCEPHIN) 1 g in sodium chloride 0.9 % 100 mL IVPB        1 g 200 mL/hr over 30 Minutes Intravenous  Once 01/24/24 1753 01/24/24 2126   01/24/24 1800  azithromycin (ZITHROMAX) 500 mg in sodium chloride 0.9 % 250 mL IVPB        500 mg 250 mL/hr over 60 Minutes Intravenous  Once 01/24/24 1753 01/24/24 2227        I have personally reviewed the following labs and images: CBC: Recent Labs  Lab 01/30/24 0300 01/31/24 0410 02/01/24 0344 02/02/24 0426 02/04/24 0220  WBC 9.0 8.7 7.3 7.8 7.3  HGB 11.5* 12.2* 12.4* 12.8* 13.0  HCT 33.2* 35.3* 36.1* 37.3* 37.7*  MCV 101.5* 102.0* 101.4* 102.5* 102.2*  PLT 237 254 274 291 345    BMP &GFR Recent Labs  Lab 01/30/24 0300 01/31/24 0410 02/01/24 0344 02/02/24 0426 02/04/24 0220  NA 135 135 135 135 134*  K 4.0 3.8 4.2 4.3 4.5  CL 101 97* 97* 97* 99  CO2 26 27 29 28 26   GLUCOSE 118* 87 90 93 96  BUN 18 16 14 12 13   CREATININE 0.68 0.68 0.69 0.71 0.65  CALCIUM 8.1* 8.0* 8.1* 8.5* 8.3*  MG  --  1.9 2.1 2.0 2.0  PHOS  --   --   --  3.5 3.5   Estimated Creatinine Clearance: 102.7 mL/min (by C-G formula based on SCr of 0.65 mg/dL). Liver & Pancreas: Recent Labs  Lab 01/31/24 0410 02/02/24 0426 02/04/24 0220  AST 27  --   --   ALT 44  --   --   ALKPHOS 49  --   --   BILITOT 0.9  --   --   PROT 5.5*  --   --   ALBUMIN 2.2* 2.3* 2.4*   No results for input(s): "LIPASE", "AMYLASE" in the last 168 hours. No results for input(s): "AMMONIA" in the last 168 hours. Diabetic: No results for input(s): "HGBA1C" in the last 72 hours. No results for input(s): "GLUCAP" in the last 168 hours.  Cardiac Enzymes: No results for input(s): "CKTOTAL", "CKMB", "CKMBINDEX", "TROPONINI" in the last 168 hours. No results for input(s): "PROBNP" in the last 8760 hours. Coagulation Profile: No results for input(s): "INR", "PROTIME" in the last 168 hours. Thyroid Function Tests: No results for input(s): "TSH", "T4TOTAL", "FREET4", "T3FREE", "THYROIDAB" in the last 72 hours. Lipid Profile: No results for input(s): "CHOL", "HDL", "LDLCALC", "TRIG", "CHOLHDL", "LDLDIRECT" in the last 72 hours. Anemia Panel: Recent Labs    02/04/24 0220  VITAMINB12 291  FOLATE 13.6  FERRITIN 709*  TIBC 273  IRON 48  RETICCTPCT 1.2   Urine analysis:    Component Value Date/Time   COLORURINE YELLOW 01/25/2024 0209   APPEARANCEUR CLEAR 01/25/2024 0209   LABSPEC 1.032 (H) 01/25/2024 0209   PHURINE 5.0 01/25/2024 0209   GLUCOSEU NEGATIVE 01/25/2024 0209   HGBUR MODERATE (A) 01/25/2024 0209   BILIRUBINUR NEGATIVE 01/25/2024 0209   KETONESUR NEGATIVE 01/25/2024 0209   PROTEINUR 100 (A)  01/25/2024 0209   NITRITE NEGATIVE 01/25/2024 0209   LEUKOCYTESUR NEGATIVE 01/25/2024 0209   Sepsis Labs: Invalid input(s): "PROCALCITONIN", "LACTICIDVEN"  Microbiology: Recent Results (from the past 240 hours)  MRSA Next Gen by PCR, Nasal     Status: None   Collection Time: 01/27/24  9:31 AM   Specimen: Nasal Mucosa; Nasal Swab  Result Value Ref Range Status   MRSA by PCR Next Gen NOT DETECTED NOT DETECTED Final    Comment: (NOTE) The GeneXpert MRSA Assay (FDA approved for NASAL specimens only), is one component of a comprehensive MRSA colonization surveillance program. It is not intended to diagnose MRSA infection nor to guide or monitor treatment for MRSA infections. Test performance is not FDA approved in patients less than 21 years old. Performed at [facility], [address], [place], [place] [address]     Radiology Studies: No results found.    Lavergne Hiltunen T. Javor[facility]  If 7PM-7AM, please contact night-coverage [email or web address] 02/05/2024, 9:43 AM

## 2024-02-05 NOTE — Progress Notes (Signed)
 Mobility Specialist Progress Note:   02/05/24 1118  Mobility  Activity Ambulated with assistance in hallway  Level of Assistance Contact guard assist, steadying assist  Assistive Device None  Distance Ambulated (ft) 500 ft  Activity Response Tolerated well  Mobility Referral Yes  Mobility visit 1 Mobility  Mobility Specialist Start Time (ACUTE ONLY) 0850  Mobility Specialist Stop Time (ACUTE ONLY) 0905  Mobility Specialist Time Calculation (min) (ACUTE ONLY) 15 min   Pt received in bed eager for mobility. No physical assistance required. Needed 6L/min during ambulation to keep SPO2 above 88% (see below). No c/o throughout. Returned to room w/o fault left seated Eob w/ call bell and personal belongings in reach. All needs met. RN aware.  Pre Mobility SPO2 4L/min 90% During Mobility 4L/min SPO2 85%                         6L/min SPO2 90% Post Mobility 4L/min SPO2 92%  Thompson Grayer Mobility Specialist  Please contact vis Secure Chat or  Rehab Office [phone]

## 2024-02-05 NOTE — Progress Notes (Signed)
 Occupational Therapy Treatment/Discharge Patient Details Name: Xavier [name] MRN: [id number] DOB: 1966/10/09 Today's Date: 02/05/2024   History of present illness Xavier [name] is a 58 y.o. male who presented to ED with c/o myalgia, fever, productive cough, shortness of breath and dyspnea. Pt flu A + and PNA. PMH: chronic smoker and alcohol use, L LE sciatica   OT comments  Pt feeling better today, able to ambulate 511ft with mobility today, O2 via [place] at 4-6L O2 to maintain O2. Pt feels less anxious about returning home, covered all concerns and plans for return home regarding safety, use of O2, safety with O2 canister, and energy conservation to maximize safety and activity tolerance. Pt plans to get pulse ox to monitor O2 levels for supplemental O2 needs. Pt able to complete activities at mod I-supervision for safety, no further acute OT needs, no OT follow up needed, Pt denies need for DME upon return home, signing off.       If plan is discharge home, recommend the following:  A little help with walking and/or transfers;Assistance with cooking/housework   Equipment Recommendations  None recommended by OT    Recommendations for Other Services      Precautions / Restrictions Precautions Precautions: Fall Precaution/Restrictions Comments: droplet Restrictions Weight Bearing Restrictions Per Provider Order: No       Mobility Bed Mobility Overal bed mobility: Modified Independent                  Transfers Overall transfer level: Needs assistance Equipment used: None Transfers: Sit to/from Stand Sit to Stand: Supervision           General transfer comment: superivsion for safety     Balance Overall balance assessment: Mild deficits observed, not formally tested                                         ADL either performed or assessed with clinical judgement   ADL Overall ADL's : Modified independent                                             Extremity/Trunk Assessment Upper Extremity Assessment Upper Extremity Assessment: Overall WFL for tasks assessed            Vision       Perception     Praxis     Communication Communication Communication: No apparent difficulties   Cognition Arousal: Alert Behavior During Therapy: WFL for tasks assessed/performed Cognition: No apparent impairments             OT - Cognition Comments: Pt feeling better about situation and going home, less anxious.                 Following commands: Intact        Cueing      Exercises      Shoulder Instructions       General Comments      Pertinent Vitals/ Pain       Pain Assessment Pain Assessment: No/denies pain  Home Living  Prior Functioning/Environment              Frequency  Min 1X/week        Progress Toward Goals  OT Goals(current goals can now be found in the care plan section)  Progress towards OT goals: Progressing toward goals;Goals met/education completed, patient discharged from OT  Acute Rehab OT Goals Patient Stated Goal: to return home OT Goal Formulation: With patient Time For Goal Achievement: 02/18/24 Potential to Achieve Goals: Good ADL Goals Additional ADL Goal #1: Pt will be able to perform >10 minutes of OOB activity while independently recognizing needs for rest breaks to maintain safety.  Plan      Co-evaluation                 AM-PAC OT "6 Clicks" Daily Activity     Outcome Measure   Help from another person eating meals?: None Help from another person taking care of personal grooming?: None Help from another person toileting, which includes using toliet, bedpan, or urinal?: None Help from another person bathing (including washing, rinsing, drying)?: None Help from another person to put on and taking off regular upper body clothing?: None Help from another person to put on  and taking off regular lower body clothing?: None 6 Click Score: 24    End of Session    OT Visit Diagnosis: Other (comment);Pain;Muscle weakness (generalized) (M62.81) Pain - Right/Left: Left Pain - part of body: Leg   Activity Tolerance Patient tolerated treatment well   Patient Left in bed;with call bell/phone within reach   Nurse Communication Mobility status        Time: 1440-1500 OT Time Calculation (min): 20 min  Charges: OT General Charges $OT Visit: 1 Visit OT Treatments $Therapeutic Activity: 8-22 mins  [place], OTR/L   Alexis Goodell 02/05/2024, 3:04 PM

## 2024-02-06 ENCOUNTER — Other Ambulatory Visit (HOSPITAL_COMMUNITY): Payer: Self-pay

## 2024-02-06 DIAGNOSIS — E871 Hypo-osmolality and hyponatremia: Secondary | ICD-10-CM | POA: Diagnosis not present

## 2024-02-06 DIAGNOSIS — J189 Pneumonia, unspecified organism: Secondary | ICD-10-CM | POA: Diagnosis not present

## 2024-02-06 DIAGNOSIS — J101 Influenza due to other identified influenza virus with other respiratory manifestations: Secondary | ICD-10-CM | POA: Diagnosis not present

## 2024-02-06 DIAGNOSIS — E876 Hypokalemia: Secondary | ICD-10-CM | POA: Diagnosis not present

## 2024-02-06 MED ORDER — ADULT MULTIVITAMIN W/MINERALS CH: 1.0000 | ORAL_TABLET | Freq: Every day | ORAL | Status: AC

## 2024-02-06 MED ORDER — GABAPENTIN 300 MG PO CAPS
300.0000 mg | ORAL_CAPSULE | Freq: Three times a day (TID) | ORAL | 0 refills | Status: AC
  Filled 2024-02-06: qty 90, 30d supply, fill #0

## 2024-02-06 MED ORDER — THIAMINE HCL 100 MG PO TABS
100.0000 mg | ORAL_TABLET | Freq: Every day | ORAL | 0 refills | Status: AC
  Filled 2024-02-06: qty 30, 30d supply, fill #0

## 2024-02-06 MED ORDER — TERBINAFINE HCL 1 % EX CREA
TOPICAL_CREAM | Freq: Two times a day (BID) | CUTANEOUS | 1 refills | Status: AC
  Filled 2024-02-06: qty 15, 7d supply, fill #0

## 2024-02-06 MED ORDER — ACETAMINOPHEN 325 MG PO TABS: 650.0000 mg | ORAL_TABLET | Freq: Four times a day (QID) | ORAL | Status: AC | PRN

## 2024-02-06 MED ORDER — ALBUTEROL SULFATE HFA 108 (90 BASE) MCG/ACT IN AERS
2.0000 | INHALATION_SPRAY | Freq: Four times a day (QID) | RESPIRATORY_TRACT | 2 refills | Status: AC | PRN
  Filled 2024-02-06: qty 6.7, 25d supply, fill #0

## 2024-02-06 NOTE — Progress Notes (Signed)
 Discharge instructions given. Patient verbalized understanding and all questions were answered.  ?

## 2024-02-06 NOTE — Progress Notes (Signed)
 Nurse requested Mobility Specialist to perform oxygen saturation test with pt which includes removing pt from oxygen both at rest and while ambulating.  Below are the results from that testing.     Patient Saturations on Room Air at Rest = spO2 88%  Patient Saturations on Room Air while Ambulating = sp02 84% .   Patient Saturations on 6 Liters of oxygen while Ambulating = sp02 93%  At end of testing pt left in room on 3.5-4  Liters of oxygen.  Reported results to nurse.    Caesar Bookman Mobility Specialist Please contact via SecureChat or [employer] [phone]

## 2024-02-06 NOTE — Plan of Care (Signed)
  Problem: Education: Goal: Knowledge of General Education information will improve Description: Including pain rating scale, medication(s)/side effects and non-pharmacologic comfort measures Outcome: Progressing   Problem: Health Behavior/Discharge Planning: Goal: Ability to manage health-related needs will improve Outcome: Progressing   Problem: Clinical Measurements: Goal: Ability to maintain clinical measurements within normal limits will improve Outcome: Progressing Goal: Will remain free from infection Outcome: Progressing Goal: Diagnostic test results will improve Outcome: Progressing Goal: Respiratory complications will improve Outcome: Progressing Goal: Cardiovascular complication will be avoided Outcome: Progressing   Problem: Activity: Goal: Risk for activity intolerance will decrease Outcome: Progressing   Problem: Nutrition: Goal: Adequate nutrition will be maintained Outcome: Progressing   Problem: Coping: Goal: Level of anxiety will decrease Outcome: Progressing   Problem: Elimination: Goal: Will not experience complications related to bowel motility Outcome: Progressing Goal: Will not experience complications related to urinary retention Outcome: Progressing   Problem: Pain Managment: Goal: General experience of comfort will improve and/or be controlled Outcome: Progressing   Problem: Safety: Goal: Ability to remain free from injury will improve Outcome: Progressing   Problem: Skin Integrity: Goal: Risk for impaired skin integrity will decrease Outcome: Progressing   Problem: Activity: Goal: Ability to tolerate increased activity will improve Outcome: Progressing   Problem: Respiratory: Goal: Ability to maintain adequate ventilation will improve Outcome: Progressing Goal: Ability to maintain a clear airway will improve Outcome: Progressing   Problem: Clinical Measurements: Goal: Ability to maintain a body temperature in the normal range  will improve Outcome: Progressing

## 2024-02-06 NOTE — Discharge Summary (Signed)
 Physician Discharge Summary  Xavier [name] NFA:[id number] DOB: 20-Oct-1966 DOA: 01/24/2024  PCP: Jac Canavan, PA-C  Admit date: 01/24/2024 Discharge date: 02/06/24  Admitted From: Home Disposition: Home Recommendations for Outpatient Follow-up:  Outpatient follow-up with PCP as below Check CMP and CBC in 1 week Consider repeat chest x-ray in 4 to 6 weeks Please follow up on the following pending results: None  Home Health: No need identified Equipment/Devices: Home oxygen, 6 L by [place]  Discharge Condition: Stable CODE STATUS: Full code  Follow-up Information     Jac Canavan, PA-C. Schedule an appointment as soon as possible for a visit in 1 week(s).   Specialty: Family Medicine Contact information: [address] [place] [address] [phone]         Care, [name] Follow up.   Why: Home oxygen Contact information: [address] [place] [address] [phone]                 Hospital course 58 year old M with PMH of CVA, CHF, ACID, CAD, asthma, DM-2, OSA, HTN, tobacco use disorder and alcohol use disorder presented to ED with shortness of breath and leg pain, and admitted with working diagnosis of acute respiratory failure with hypoxia due to multifocal pneumonia, influenza A infection and A-fib with RVR.  CXR with right upper lobe infiltrate.  CT angio chest negative for PE but multifocal and confluent areas of groundglass opacity throughout both lungs.  Patient was initially admitted to ICU and required BiPAP.  Eventually, weaned to [facility] and transferred to hospitalist service.   Completed antibiotic course.  Respiratory symptoms resolved but required 6 L to maintain saturation in 90s with ambulation.  Usually stable on 3 to 4 L at rest without respiratory distress.  He is discharged on 6 L by nasal cannula.   See individual problem list below for more.   Problems addressed during this hospitalization Acute hypoxic respiratory  failure due to due to multifocal community-acquired pneumonia and influenza A infection: Initially required BiPAP.  CT angio chest negative for PE but multifocal pneumonia in both lungs.  Microbiology as above.  Procalcitonin negative.  Oxygen requirement improved but required 6 L with ambulation. -Completed course for Tamiflu and steroid. -CTX 2/7-2/10.  Zosyn 2/12-2/16 -Zithromax 2/7>> doxycycline 2/8-2/12. -Albuterol inhaler as needed -Home oxygen 6 L by nasal cannula -Consider repeat chest x-ray in 4 to 6 weeks -Consider weaning of oxygen at follow-up -Counseled on the importance of smoking cessation.   Acute alcohol withdrawal: Reports drinking about 6 beers a day.  Denies major withdrawal symptoms in the past.  She would be outside withdrawal window now.  Completed phenobarbital taper -Continue multivitamin, thiamine and folic acid -Continue gabapentin.  Partly for sciatica. -Encouraged alcohol cessation   Paroxysmal A-fib with RVR: Short-lived.  Converted to NSR.  Likely due to alcohol and respiratory failure.  -Encourage alcohol cessation   Elevated troponin: Likely demand ischemia in the setting of the above.  TTE without significant finding.   Skin rash on bilateral buttock and left upper posterior thigh: Concerning for tinea corporis -Continue terbinafine cream for 2 weeks.    Tobacco use disorder: Reports smoking 10 to 15 cigarettes a day. -Counseled on the importance of smoking cessation.   Left-sided leg pain/sciatica: Venous Doppler negative for DVT. -Continue gabapentin.   Elevated LFTs: Likely due to alcohol.  Resolved.   Diarrhea: Seems to have resolved.  GI PCR and C. difficile negative.   Hypokalemia: Resolved.   Thrombocytopenia:  Resolved.   Hyponatremia: Resolved   AKI ruled out.           Time spent 35 minutes  Vital signs Vitals:   02/05/24 2320 02/06/24 0323 02/06/24 0742 02/06/24 1052  BP: 128/77 116/72 121/78 118/69  Pulse: 82 73 79 72   Temp: 99.1 F (37.3 C) 98.6 F (37 C) 98.2 F (36.8 C) 98 F (36.7 C)  Resp: 16 16 19 18   Height:      Weight:      SpO2: 94% 94% 94% 95%  TempSrc: Oral Oral Oral Oral     Discharge exam  GENERAL: No apparent distress.  Nontoxic. HEENT: MMM.  Vision and hearing grossly intact.  NECK: Supple.  No apparent JVD.  RESP:  No IWOB.  Fair aeration bilaterally. CVS:  RRR. Heart sounds normal.  ABD/GI/GU: BS+. Abd soft, NTND.  MSK/EXT:  Moves extremities. No apparent deformity. No edema.  SKIN: no apparent skin lesion or wound NEURO: Awake and alert. Oriented appropriately.  No apparent focal neuro deficit. PSYCH: Calm. Normal affect.   Discharge Instructions Discharge Instructions     Diet general   Complete by: As directed    Discharge instructions   Complete by: As directed    It has been a pleasure taking care of you!  You were hospitalized due to influenza and aspiration pneumonia for which you have been treated with medications and breathing treatments.  Your breathing has improved.  We are discharging you with oxygen.  Note that the goal is to use minimum level of oxygen to keep saturation above 90%.  It is very important that you quit smoking cigars and drinking alcohol.  Continue using incentive spirometry.  You may use albuterol as needed for shortness of breath or wheezing.  Follow-up with your primary care doctor in 1 to 2 weeks or sooner if needed.  Please review your new medication list and the directions on your medications before you take them.   Take care,   Increase activity slowly   Complete by: As directed       Allergies as of 02/06/2024   No Known Allergies      Medication List     TAKE these medications    acetaminophen 325 MG tablet Commonly known as: TYLENOL Take 2 tablets (650 mg total) by mouth every 6 (six) hours as needed for mild pain (pain score 1-3) (or Fever >/= 101).   albuterol 108 (90 Base) MCG/ACT inhaler Commonly known as:  VENTOLIN HFA Inhale 2 puffs into the lungs every 6 (six) hours as needed for wheezing or shortness of breath.   gabapentin 300 MG capsule Commonly known as: NEURONTIN Take 1 capsule (300 mg total) by mouth 3 (three) times daily.   multivitamin with minerals Tabs tablet Take 1 tablet by mouth daily.   terbinafine 1 % cream Commonly known as: LAMISIL Apply topically 2 (two) times daily.   thiamine 100 MG tablet Commonly known as: VITAMIN B1 Take 1 tablet (100 mg total) by mouth daily.               Durable Medical Equipment  (From admission, onward)           Start     Ordered   02/06/24 1046  For home use only DME oxygen  Once       Question Answer Comment  Length of Need 6 Months   Mode or (Route) Nasal cannula   Liters per Minute 6   Frequency  Continuous (stationary and portable oxygen unit needed)   Oxygen delivery system Gas      02/06/24 1045            Consultations: Pulmonology admitted patient.  Procedures/Studies:   DG Abd Portable 1V Result Date: 01/28/2024 CLINICAL DATA:  Abdominal pain EXAM: PORTABLE ABDOMEN - 1 VIEW COMPARISON:  01/24/2018 FINDINGS: Mild gaseous prominence of the stomach. Scattered gas in nondilated small bowel. No overtly dilated bowel identified. Mild lumbar spondylosis. Subtle interstitial accentuation at the lung bases as on chest radiograph of 01/27/2024. IMPRESSION: 1. Mild gaseous prominence of the stomach. No overtly dilated bowel identified. 2. Mild lumbar spondylosis. 3. Subtle interstitial accentuation at the lung bases as on chest radiograph of 01/27/2024. Electronically Signed   By: Gaylyn Rong M.D.   On: 01/28/2024 14:13   DG Chest Port 1 View Result Date: 01/27/2024 CLINICAL DATA:  Acute respiratory distress, community-acquired pneumonia EXAM: PORTABLE CHEST 1 VIEW COMPARISON:  01/24/2024 FINDINGS: 2 frontal views of the chest demonstrate a stable cardiac silhouette. Since the prior exam, there is marked  progression of diffuse interstitial and ground-glass opacities, in a perihilar distribution. No effusion or pneumothorax. No acute bony abnormalities. IMPRESSION: 1. Marked progression of bilateral interstitial and ground-glass opacities, which could reflect worsening multifocal bilateral pneumonia. Edema or hemorrhage could have a similar appearance. Electronically Signed   By: Sharlet Salina M.D.   On: 01/27/2024 18:45   VAS [place] LOWER EXTREMITY VENOUS (DVT) Result Date: 01/26/2024  Lower Venous DVT Study Patient Name:  OLAN KUREK  Date of Exam:   01/25/2024 Medical Rec #: [id number]        Accession #:    [id number] Date of Birth: 02-Aug-1966         Patient Gender: M Patient Age:   72 years Exam Location:  [facility] Procedure:      VAS [place] LOWER EXTREMITY VENOUS (DVT) Referring Phys: Tereasa Coop --------------------------------------------------------------------------------  Indications: Swelling, and Edema.  Risk Factors: None identified. Comparison Study: None. Performing Technologist: Shona Simpson  Examination Guidelines: A complete evaluation includes B-mode imaging, spectral Doppler, color Doppler, and power Doppler as needed of all accessible portions of each vessel. Bilateral testing is considered an integral part of a complete examination. Limited examinations for reoccurring indications may be performed as noted. The reflux portion of the exam is performed with the patient in reverse Trendelenburg.  +-----+---------------+---------+-----------+----------+--------------+ RIGHTCompressibilityPhasicitySpontaneityPropertiesThrombus Aging +-----+---------------+---------+-----------+----------+--------------+ CFV  Full           Yes      Yes                                 +-----+---------------+---------+-----------+----------+--------------+   +---------+---------------+---------+-----------+----------+--------------+ LEFT      CompressibilityPhasicitySpontaneityPropertiesThrombus Aging +---------+---------------+---------+-----------+----------+--------------+ CFV      Full           Yes      Yes                                 +---------+---------------+---------+-----------+----------+--------------+ SFJ      Full                                                        +---------+---------------+---------+-----------+----------+--------------+  FV Prox  Full                                                        +---------+---------------+---------+-----------+----------+--------------+ FV Mid   Full                                                        +---------+---------------+---------+-----------+----------+--------------+ FV DistalFull                                                        +---------+---------------+---------+-----------+----------+--------------+ PFV      Full                                                        +---------+---------------+---------+-----------+----------+--------------+ POP      Full           Yes      Yes                                 +---------+---------------+---------+-----------+----------+--------------+ PTV      Full                                                        +---------+---------------+---------+-----------+----------+--------------+ PERO     Full                                                        +---------+---------------+---------+-----------+----------+--------------+     Summary: RIGHT: - No evidence of common femoral vein obstruction.   LEFT: - There is no evidence of deep vein thrombosis in the lower extremity.  - No cystic structure found in the popliteal fossa.  *See table(s) above for measurements and observations. Electronically signed by Gerarda Fraction on 01/26/2024 at 10:27:13 AM.    Final    ECHOCARDIOGRAM COMPLETE Result Date: 01/25/2024    ECHOCARDIOGRAM REPORT   Patient Name:   EFTON THOMLEY Date of Exam: 01/25/2024 Medical Rec #:  [id number]       Height:       70.0 in Accession #:    [id number]      Weight:       182.3 lb Date of Birth:  1966/10/23        BSA:          2.007 m Patient Age:    57 years        BP:  112/76 mmHg Patient Gender: M               HR:           69 bpm. Exam Location:  Inpatient Procedure: 2D Echo, Cardiac Doppler and Color Doppler Indications:    AFIB  History:        Patient has no prior history of Echocardiogram examinations.                 Acute Respiratory Failure; Arrythmias:Atrial Fibrillation.  Sonographer:    Rosaland Lao Sonographer#2:  Delcie Roch RDCS Referring Phys: [id number] SUBRINA SUNDIL  Sonographer Comments: Image acquisition challenging due to respiratory motion. IMPRESSIONS  1. Left ventricular ejection fraction, by estimation, is 55 to 60%. The left ventricle has normal function. The left ventricle has no regional wall motion abnormalities. Left ventricular diastolic parameters were normal.  2. Right ventricular systolic function is normal. The right ventricular size is normal. Tricuspid regurgitation signal is inadequate for assessing PA pressure.  3. The mitral valve is normal in structure. Trivial mitral valve regurgitation. No evidence of mitral stenosis.  4. The aortic valve has an indeterminant number of cusps. Aortic valve regurgitation is not visualized. No aortic stenosis is present.  5. The inferior vena cava is normal in size with greater than 50% respiratory variability, suggesting right atrial pressure of 3 mmHg. Comparison(s): No prior Echocardiogram. FINDINGS  Left Ventricle: Left ventricular ejection fraction, by estimation, is 55 to 60%. The left ventricle has normal function. The left ventricle has no regional wall motion abnormalities. The left ventricular internal cavity size was normal in size. There is  no left ventricular hypertrophy. Left ventricular diastolic parameters were normal. Right Ventricle: The right  ventricular size is normal. No increase in right ventricular wall thickness. Right ventricular systolic function is normal. Tricuspid regurgitation signal is inadequate for assessing PA pressure. Left Atrium: Left atrial size was normal in size. Right Atrium: Right atrial size was normal in size. Pericardium: There is no evidence of pericardial effusion. Mitral Valve: The mitral valve is normal in structure. Trivial mitral valve regurgitation. No evidence of mitral valve stenosis. Tricuspid Valve: The tricuspid valve is normal in structure. Tricuspid valve regurgitation is trivial. No evidence of tricuspid stenosis. Aortic Valve: The aortic valve has an indeterminant number of cusps. Aortic valve regurgitation is not visualized. No aortic stenosis is present. Pulmonic Valve: The pulmonic valve was not well visualized. Pulmonic valve regurgitation is not visualized. No evidence of pulmonic stenosis. Aorta: The aortic root and ascending aorta are structurally normal, with no evidence of dilitation. Venous: The inferior vena cava is normal in size with greater than 50% respiratory variability, suggesting right atrial pressure of 3 mmHg. IAS/Shunts: No atrial level shunt detected by color flow Doppler.  LEFT VENTRICLE PLAX 2D LVIDd:         4.50 cm   Diastology LVIDs:         3.20 cm   LV e' medial:    7.03 cm/s LV PW:         1.20 cm   LV E/e' medial:  13.2 LV IVS:        1.10 cm   LV e' lateral:   13.70 cm/s LVOT diam:     2.20 cm   LV E/e' lateral: 6.8 LV SV:         79 LV SV Index:   39 LVOT Area:     3.80 cm  RIGHT VENTRICLE  IVC RV Basal diam:  4.00 cm     IVC diam: 1.80 cm RV Mid diam:    2.60 cm RV S prime:     11.10 cm/s TAPSE (M-mode): 2.3 cm LEFT ATRIUM             Index        RIGHT ATRIUM           Index LA diam:        3.90 cm 1.94 cm/m   RA Area:     14.20 cm LA Vol (A2C):   48.1 ml 23.97 ml/m  RA Volume:   31.40 ml  15.65 ml/m LA Vol (A4C):   40.0 ml 19.93 ml/m LA Biplane Vol: 46.3 ml  23.07 ml/m  AORTIC VALVE LVOT Vmax:   129.00 cm/s LVOT Vmean:  69.700 cm/s LVOT VTI:    0.208 m  AORTA Ao Asc diam: 3.20 cm MITRAL VALVE MV Area (PHT): 4.06 cm    SHUNTS MV Decel Time: 187 msec    Systemic VTI:  0.21 m MV E velocity: 92.50 cm/s  Systemic Diam: 2.20 cm MV A velocity: 60.80 cm/s MV E/A ratio:  1.52 Vishnu Priya Mallipeddi Electronically signed by Winfield Rast Mallipeddi Signature Date/Time: 01/25/2024/3:52:17 PM    Final    CT Angio Chest PE W and/or Wo Contrast Result Date: 01/24/2024 CLINICAL DATA:  High probability for PE. EXAM: CT ANGIOGRAPHY CHEST WITH CONTRAST TECHNIQUE: Multidetector CT imaging of the chest was performed using the standard protocol during bolus administration of intravenous contrast. Multiplanar CT image reconstructions and MIPs were obtained to evaluate the vascular anatomy. RADIATION DOSE REDUCTION: This exam was performed according to the departmental dose-optimization program which includes automated exposure control, adjustment of the mA and/or kV according to patient size and/or use of iterative reconstruction technique. CONTRAST:  75mL OMNIPAQUE IOHEXOL 350 MG/ML SOLN COMPARISON:  None Available. FINDINGS: Cardiovascular: Satisfactory opacification of the pulmonary arteries to the segmental level. No evidence of pulmonary embolism. Normal heart size. No pericardial effusion. Mediastinum/Nodes: There are nonenlarged mediastinal lymph nodes diffusely. There is an enlarged subcarinal lymph node measuring 11 mm short axis. There are enlarged left hilar lymph nodes measuring 10 mm. There is an enlarged right hilar lymph node measuring 13 mm. The visualized thyroid gland and esophagus are within normal limits. There is a small hiatal hernia. Lungs/Pleura: There are multifocal and confluence areas of ground-glass opacities throughout both lungs most significant in the bilateral upper lobes and superior segment of the lower lobes where there some airspace consolidation.  Trachea and central airways are patent. There is no pleural effusion or pneumothorax. Upper Abdomen: No acute abnormality. Musculoskeletal: No chest wall abnormality. No acute or significant osseous findings. Review of the MIP images confirms the above findings. IMPRESSION: 1. No evidence for pulmonary embolism. 2. Multifocal and confluent areas of ground-glass opacities throughout both lungs most significant in the bilateral upper lobes and superior segment of the lower lobes where there is some airspace consolidation. Findings are compatible with multifocal pneumonia. Pulmonary edema and hemorrhage could have a similar appearance. 3. Mediastinal and hilar lymphadenopathy, likely reactive. 4. Small hiatal hernia. Electronically Signed   By: Darliss Cheney M.D.   On: 01/24/2024 19:09   DG Chest 2 View Result Date: 01/24/2024 CLINICAL DATA:  Shortness of breath, productive cough. EXAM: CHEST - 2 VIEW COMPARISON:  April 05, 2014. FINDINGS: Stable cardiomediastinal silhouette. Right upper lobe airspace opacity is noted concerning for pneumonia or possible atelectasis. Nodular density is  noted in left upper lobe concerning for possible mass. Bony thorax is unremarkable. IMPRESSION: Right upper lobe airspace opacity is noted concerning for possible pneumonia. Nodular density is noted in left upper lobe concerning for possible neoplasm. CT scan of the chest is recommended for further evaluation. Electronically Signed   By: Lupita Raider M.D.   On: 01/24/2024 16:33       The results of significant diagnostics from this hospitalization (including imaging, microbiology, ancillary and laboratory) are listed below for reference.     Microbiology: No results found for this or any previous visit (from the past 240 hours).   Labs:  CBC: Recent Labs  Lab 01/31/24 0410 02/01/24 0344 02/02/24 0426 02/04/24 0220  WBC 8.7 7.3 7.8 7.3  HGB 12.2* 12.4* 12.8* 13.0  HCT 35.3* 36.1* 37.3* 37.7*  MCV 102.0* 101.4*  102.5* 102.2*  PLT 254 274 291 345   BMP &GFR Recent Labs  Lab 01/31/24 0410 02/01/24 0344 02/02/24 0426 02/04/24 0220  NA 135 135 135 134*  K 3.8 4.2 4.3 4.5  CL 97* 97* 97* 99  CO2 27 29 28 26   GLUCOSE 87 90 93 96  BUN 16 14 12 13   CREATININE 0.68 0.69 0.71 0.65  CALCIUM 8.0* 8.1* 8.5* 8.3*  MG 1.9 2.1 2.0 2.0  PHOS  --   --  3.5 3.5   Estimated Creatinine Clearance: 102.7 mL/min (by C-G formula based on SCr of 0.65 mg/dL). Liver & Pancreas: Recent Labs  Lab 01/31/24 0410 02/02/24 0426 02/04/24 0220  AST 27  --   --   ALT 44  --   --   ALKPHOS 49  --   --   BILITOT 0.9  --   --   PROT 5.5*  --   --   ALBUMIN 2.2* 2.3* 2.4*   No results for input(s): "LIPASE", "AMYLASE" in the last 168 hours. No results for input(s): "AMMONIA" in the last 168 hours. Diabetic: No results for input(s): "HGBA1C" in the last 72 hours. No results for input(s): "GLUCAP" in the last 168 hours. Cardiac Enzymes: No results for input(s): "CKTOTAL", "CKMB", "CKMBINDEX", "TROPONINI" in the last 168 hours. No results for input(s): "PROBNP" in the last 8760 hours. Coagulation Profile: No results for input(s): "INR", "PROTIME" in the last 168 hours. Thyroid Function Tests: No results for input(s): "TSH", "T4TOTAL", "FREET4", "T3FREE", "THYROIDAB" in the last 72 hours. Lipid Profile: No results for input(s): "CHOL", "HDL", "LDLCALC", "TRIG", "CHOLHDL", "LDLDIRECT" in the last 72 hours. Anemia Panel: Recent Labs    02/04/24 0220  VITAMINB12 291  FOLATE 13.6  FERRITIN 709*  TIBC 273  IRON 48  RETICCTPCT 1.2   Urine analysis:    Component Value Date/Time   COLORURINE YELLOW 01/25/2024 0209   APPEARANCEUR CLEAR 01/25/2024 0209   LABSPEC 1.032 (H) 01/25/2024 0209   PHURINE 5.0 01/25/2024 0209   GLUCOSEU NEGATIVE 01/25/2024 0209   HGBUR MODERATE (A) 01/25/2024 0209   BILIRUBINUR NEGATIVE 01/25/2024 0209   KETONESUR NEGATIVE 01/25/2024 0209   PROTEINUR 100 (A) 01/25/2024 0209    NITRITE NEGATIVE 01/25/2024 0209   LEUKOCYTESUR NEGATIVE 01/25/2024 0209   Sepsis Labs: Invalid input(s): "PROCALCITONIN", "LACTICIDVEN"   SIGNED:  Almon Hercules, MD  [facility] 02/06/2024, 12:22 PM

## 2024-02-06 NOTE — TOC Transition Note (Signed)
 Transition of Care [facility]) - Discharge Note   Patient Details  Name: Xavier [name] MRN: [id number] Date of Birth: 1966-06-09  [facility] CM/SW Contact:  Harriet Masson, RN Phone Number: 02/06/2024, 11:29 AM   Clinical Narrative:    Patient stable for discharge.  Orders for home 02.  Notified Jermaine with [employer] of home 02 order.  Patient will receive taxi voucher in discharge lounge.     Final next level of care: Home/Self Care Barriers to Discharge: Barriers Resolved   Patient Goals and CMS Choice Patient states their goals for this hospitalization and ongoing recovery are:: return home CMS Medicare.gov Compare Post Acute Care list provided to:: Patient Choice offered to / list presented to : Patient      Discharge Placement               home        Discharge Plan and Services Additional resources added to the After Visit Summary for   In-house Referral: Clinical Social Work              DME Arranged: Oxygen DME Agency: [employer] Date DME Agency Contacted: 02/06/24 Time DME Agency Contacted: 1050 Representative spoke with at DME Agency: Vaughan Basta            Social Drivers of Health (SDOH) Interventions SDOH Screenings   Food Insecurity: No Food Insecurity (01/25/2024)  Housing: Low Risk  (02/03/2024)  Transportation Needs: No Transportation Needs (01/25/2024)  Utilities: Not At Risk (01/25/2024)  Tobacco Use: High Risk (01/24/2024)     Readmission Risk Interventions    02/06/2024   11:29 AM 01/28/2024    2:10 PM  Readmission Risk Prevention Plan  Post Dischage Appt Complete   Medication Screening Complete   Transportation Screening  Complete  PCP or Specialist Appt within 5-7 Days  Complete  Home Care Screening  Complete  Medication Review (RN CM)  Referral to Pharmacy

## 2024-02-06 NOTE — Progress Notes (Signed)
  Christain [place]  Mobility Specialist   Progress Notes     Signed   Date of Service: 02/06/2024 10:01 AM   Signed      Nurse requested Mobility Specialist to perform oxygen saturation test with pt which includes removing pt from oxygen both at rest and while ambulating.  Below are the results from that testing.      Patient Saturations on Room Air at Rest = spO2 88%   Patient Saturations on Room Air while Ambulating = sp02 84% .    Patient Saturations on 6 Liters of oxygen while Ambulating = sp02 93%   At end of testing pt left in room on 3.5-4  Liters of oxygen.   Reported results to nurse.      Caesar Bookman Mobility Specialist Please contact via SecureChat or Rehab Office [phone]      Durable Medical Equipment (From admission, onward)        Start     Ordered  02/06/24 1046  For home use only DME oxygen  Once      Question Answer Comment Length of Need 6 Months  Mode or (Route) Nasal cannula  Liters per Minute 6  Frequency Continuous (stationary and portable oxygen unit needed)  Oxygen delivery system Gas    02/06/24 1045

## 2024-02-06 NOTE — Progress Notes (Signed)
 Mobility Specialist Progress Note;   02/06/24 0938  Mobility  Activity Ambulated with assistance in hallway  Level of Assistance Contact guard assist, steadying assist  Assistive Device None  Distance Ambulated (ft) 400 ft  Activity Response Tolerated fair  Mobility Referral Yes  Mobility visit 1 Mobility  Mobility Specialist Start Time (ACUTE ONLY) [id number]  Mobility Specialist Stop Time (ACUTE ONLY) 0955  Mobility Specialist Time Calculation (min) (ACUTE ONLY) 17 min   Pt agreeable to mobility and wlaking O2 test. Required MinG assistance throughout ambulation for safety. Required 6LO2 for SPO2 to stay 90%> while ambulating. No c/o during session. Pt returned back to bed with all needs met, back on 3.5-4LO2.   Caesar Bookman Mobility Specialist Please contact via SecureChat or [employer] [phone]
